# Patient Record
Sex: Male | Born: 1940
Health system: Southern US, Community
[De-identification: ages and names within clinical notes are randomized; demographics above are authoritative.]

## PROBLEM LIST (undated history)

## (undated) DIAGNOSIS — E079 Disorder of thyroid, unspecified: Secondary | ICD-10-CM

## (undated) DIAGNOSIS — E119 Type 2 diabetes mellitus without complications: Secondary | ICD-10-CM

## (undated) DIAGNOSIS — C801 Malignant (primary) neoplasm, unspecified: Secondary | ICD-10-CM

## (undated) DIAGNOSIS — T7840XA Allergy, unspecified, initial encounter: Secondary | ICD-10-CM

## (undated) DIAGNOSIS — E785 Hyperlipidemia, unspecified: Secondary | ICD-10-CM

## (undated) DIAGNOSIS — I10 Essential (primary) hypertension: Secondary | ICD-10-CM

## (undated) DIAGNOSIS — M1990 Unspecified osteoarthritis, unspecified site: Secondary | ICD-10-CM

## (undated) DIAGNOSIS — J45909 Unspecified asthma, uncomplicated: Secondary | ICD-10-CM

## (undated) DIAGNOSIS — I4892 Unspecified atrial flutter: Secondary | ICD-10-CM

## (undated) DIAGNOSIS — I4891 Unspecified atrial fibrillation: Secondary | ICD-10-CM

## (undated) DIAGNOSIS — D126 Benign neoplasm of colon, unspecified: Secondary | ICD-10-CM

## (undated) HISTORY — DX: Benign neoplasm of colon, unspecified: D12.6

## (undated) HISTORY — PX: OTHER SURGICAL HISTORY: SHX169

## (undated) HISTORY — DX: Unspecified atrial fibrillation: I48.91

## (undated) HISTORY — DX: Type 2 diabetes mellitus without complications: E11.9

## (undated) HISTORY — DX: Hyperlipidemia, unspecified: E78.5

## (undated) HISTORY — PX: COLONOSCOPY: SHX174

## (undated) HISTORY — PX: INGUINAL HERNIA REPAIR: SUR1180

## (undated) HISTORY — DX: Allergy, unspecified, initial encounter: T78.40XA

## (undated) HISTORY — DX: Unspecified osteoarthritis, unspecified site: M19.90

## (undated) HISTORY — PX: TONSILLECTOMY: SUR1361

## (undated) HISTORY — DX: Unspecified asthma, uncomplicated: J45.909

## (undated) HISTORY — DX: Essential (primary) hypertension: I10

## (undated) HISTORY — DX: Disorder of thyroid, unspecified: E07.9

## (undated) HISTORY — DX: Malignant (primary) neoplasm, unspecified: C80.1

## (undated) HISTORY — DX: Unspecified atrial flutter: I48.92

## (undated) HISTORY — PX: MEDIAL PARTIAL KNEE REPLACEMENT: SHX5965

---

## 2016-11-02 DIAGNOSIS — Z23 Encounter for immunization: Secondary | ICD-10-CM | POA: Diagnosis not present

## 2017-01-25 DIAGNOSIS — Z0000 Encounter for general adult medical examination without abnormal findings: Secondary | ICD-10-CM | POA: Diagnosis not present

## 2017-01-25 DIAGNOSIS — R5383 Other fatigue: Secondary | ICD-10-CM | POA: Diagnosis not present

## 2017-01-25 DIAGNOSIS — Z713 Dietary counseling and surveillance: Secondary | ICD-10-CM | POA: Diagnosis not present

## 2017-01-25 DIAGNOSIS — L819 Disorder of pigmentation, unspecified: Secondary | ICD-10-CM | POA: Diagnosis not present

## 2017-01-25 DIAGNOSIS — Z0001 Encounter for general adult medical examination with abnormal findings: Secondary | ICD-10-CM | POA: Diagnosis not present

## 2017-01-25 DIAGNOSIS — Z1389 Encounter for screening for other disorder: Secondary | ICD-10-CM | POA: Diagnosis not present

## 2017-01-25 DIAGNOSIS — Z6828 Body mass index (BMI) 28.0-28.9, adult: Secondary | ICD-10-CM | POA: Diagnosis not present

## 2017-01-25 DIAGNOSIS — Z91013 Allergy to seafood: Secondary | ICD-10-CM | POA: Diagnosis not present

## 2017-01-25 DIAGNOSIS — I10 Essential (primary) hypertension: Secondary | ICD-10-CM | POA: Diagnosis not present

## 2017-01-25 DIAGNOSIS — E785 Hyperlipidemia, unspecified: Secondary | ICD-10-CM | POA: Diagnosis not present

## 2017-01-25 DIAGNOSIS — E119 Type 2 diabetes mellitus without complications: Secondary | ICD-10-CM | POA: Diagnosis not present

## 2017-01-25 DIAGNOSIS — E039 Hypothyroidism, unspecified: Secondary | ICD-10-CM | POA: Diagnosis not present

## 2017-03-13 DIAGNOSIS — I10 Essential (primary) hypertension: Secondary | ICD-10-CM | POA: Diagnosis not present

## 2017-03-13 DIAGNOSIS — E039 Hypothyroidism, unspecified: Secondary | ICD-10-CM | POA: Diagnosis not present

## 2017-03-13 DIAGNOSIS — E785 Hyperlipidemia, unspecified: Secondary | ICD-10-CM | POA: Diagnosis not present

## 2017-03-13 DIAGNOSIS — J45909 Unspecified asthma, uncomplicated: Secondary | ICD-10-CM | POA: Diagnosis not present

## 2017-03-13 DIAGNOSIS — Z1389 Encounter for screening for other disorder: Secondary | ICD-10-CM | POA: Diagnosis not present

## 2017-03-13 DIAGNOSIS — Z713 Dietary counseling and surveillance: Secondary | ICD-10-CM | POA: Diagnosis not present

## 2017-03-13 DIAGNOSIS — Z91013 Allergy to seafood: Secondary | ICD-10-CM | POA: Diagnosis not present

## 2017-03-13 DIAGNOSIS — L819 Disorder of pigmentation, unspecified: Secondary | ICD-10-CM | POA: Diagnosis not present

## 2017-03-13 DIAGNOSIS — Z0000 Encounter for general adult medical examination without abnormal findings: Secondary | ICD-10-CM | POA: Diagnosis not present

## 2017-03-13 DIAGNOSIS — E119 Type 2 diabetes mellitus without complications: Secondary | ICD-10-CM | POA: Diagnosis not present

## 2017-03-20 DIAGNOSIS — Z0000 Encounter for general adult medical examination without abnormal findings: Secondary | ICD-10-CM | POA: Diagnosis not present

## 2017-03-20 DIAGNOSIS — Z91013 Allergy to seafood: Secondary | ICD-10-CM | POA: Diagnosis not present

## 2017-03-20 DIAGNOSIS — L819 Disorder of pigmentation, unspecified: Secondary | ICD-10-CM | POA: Diagnosis not present

## 2017-03-20 DIAGNOSIS — R29890 Loss of height: Secondary | ICD-10-CM | POA: Diagnosis not present

## 2017-03-20 DIAGNOSIS — I10 Essential (primary) hypertension: Secondary | ICD-10-CM | POA: Diagnosis not present

## 2017-03-20 DIAGNOSIS — J45909 Unspecified asthma, uncomplicated: Secondary | ICD-10-CM | POA: Diagnosis not present

## 2017-03-20 DIAGNOSIS — E119 Type 2 diabetes mellitus without complications: Secondary | ICD-10-CM | POA: Diagnosis not present

## 2017-03-20 DIAGNOSIS — E785 Hyperlipidemia, unspecified: Secondary | ICD-10-CM | POA: Diagnosis not present

## 2017-03-20 DIAGNOSIS — M810 Age-related osteoporosis without current pathological fracture: Secondary | ICD-10-CM | POA: Diagnosis not present

## 2017-03-20 DIAGNOSIS — E039 Hypothyroidism, unspecified: Secondary | ICD-10-CM | POA: Diagnosis not present

## 2017-03-20 DIAGNOSIS — E291 Testicular hypofunction: Secondary | ICD-10-CM | POA: Diagnosis not present

## 2017-03-20 DIAGNOSIS — M85851 Other specified disorders of bone density and structure, right thigh: Secondary | ICD-10-CM | POA: Diagnosis not present

## 2017-03-20 DIAGNOSIS — Z6827 Body mass index (BMI) 27.0-27.9, adult: Secondary | ICD-10-CM | POA: Diagnosis not present

## 2017-03-20 DIAGNOSIS — Z780 Asymptomatic menopausal state: Secondary | ICD-10-CM | POA: Diagnosis not present

## 2017-03-20 DIAGNOSIS — Z713 Dietary counseling and surveillance: Secondary | ICD-10-CM | POA: Diagnosis not present

## 2017-04-28 DIAGNOSIS — E039 Hypothyroidism, unspecified: Secondary | ICD-10-CM | POA: Diagnosis not present

## 2017-10-06 DIAGNOSIS — Z23 Encounter for immunization: Secondary | ICD-10-CM | POA: Diagnosis not present

## 2017-10-13 DIAGNOSIS — E291 Testicular hypofunction: Secondary | ICD-10-CM | POA: Diagnosis not present

## 2017-10-13 DIAGNOSIS — H3530 Unspecified macular degeneration: Secondary | ICD-10-CM | POA: Diagnosis not present

## 2017-10-13 DIAGNOSIS — E1165 Type 2 diabetes mellitus with hyperglycemia: Secondary | ICD-10-CM | POA: Diagnosis not present

## 2017-10-13 DIAGNOSIS — I10 Essential (primary) hypertension: Secondary | ICD-10-CM | POA: Diagnosis not present

## 2017-10-13 DIAGNOSIS — E782 Mixed hyperlipidemia: Secondary | ICD-10-CM | POA: Diagnosis not present

## 2017-10-13 DIAGNOSIS — E039 Hypothyroidism, unspecified: Secondary | ICD-10-CM | POA: Diagnosis not present

## 2017-11-01 DIAGNOSIS — H2513 Age-related nuclear cataract, bilateral: Secondary | ICD-10-CM | POA: Diagnosis not present

## 2017-11-01 DIAGNOSIS — H353132 Nonexudative age-related macular degeneration, bilateral, intermediate dry stage: Secondary | ICD-10-CM | POA: Diagnosis not present

## 2017-11-01 DIAGNOSIS — E119 Type 2 diabetes mellitus without complications: Secondary | ICD-10-CM | POA: Diagnosis not present

## 2017-11-25 DIAGNOSIS — H109 Unspecified conjunctivitis: Secondary | ICD-10-CM | POA: Diagnosis not present

## 2017-12-06 DIAGNOSIS — H109 Unspecified conjunctivitis: Secondary | ICD-10-CM | POA: Diagnosis not present

## 2017-12-06 DIAGNOSIS — J069 Acute upper respiratory infection, unspecified: Secondary | ICD-10-CM | POA: Diagnosis not present

## 2017-12-19 DIAGNOSIS — E291 Testicular hypofunction: Secondary | ICD-10-CM | POA: Diagnosis not present

## 2017-12-19 DIAGNOSIS — Z0000 Encounter for general adult medical examination without abnormal findings: Secondary | ICD-10-CM | POA: Diagnosis not present

## 2017-12-19 DIAGNOSIS — E039 Hypothyroidism, unspecified: Secondary | ICD-10-CM | POA: Diagnosis not present

## 2017-12-19 DIAGNOSIS — E782 Mixed hyperlipidemia: Secondary | ICD-10-CM | POA: Diagnosis not present

## 2017-12-19 DIAGNOSIS — N390 Urinary tract infection, site not specified: Secondary | ICD-10-CM | POA: Diagnosis not present

## 2017-12-19 DIAGNOSIS — Z125 Encounter for screening for malignant neoplasm of prostate: Secondary | ICD-10-CM | POA: Diagnosis not present

## 2017-12-19 DIAGNOSIS — I10 Essential (primary) hypertension: Secondary | ICD-10-CM | POA: Diagnosis not present

## 2017-12-26 DIAGNOSIS — Z1211 Encounter for screening for malignant neoplasm of colon: Secondary | ICD-10-CM | POA: Diagnosis not present

## 2017-12-26 DIAGNOSIS — Z1212 Encounter for screening for malignant neoplasm of rectum: Secondary | ICD-10-CM | POA: Diagnosis not present

## 2017-12-28 DIAGNOSIS — E039 Hypothyroidism, unspecified: Secondary | ICD-10-CM | POA: Diagnosis not present

## 2017-12-28 DIAGNOSIS — R972 Elevated prostate specific antigen [PSA]: Secondary | ICD-10-CM | POA: Diagnosis not present

## 2017-12-28 DIAGNOSIS — E1165 Type 2 diabetes mellitus with hyperglycemia: Secondary | ICD-10-CM | POA: Diagnosis not present

## 2017-12-28 DIAGNOSIS — I10 Essential (primary) hypertension: Secondary | ICD-10-CM | POA: Diagnosis not present

## 2017-12-28 DIAGNOSIS — E291 Testicular hypofunction: Secondary | ICD-10-CM | POA: Diagnosis not present

## 2017-12-28 DIAGNOSIS — E782 Mixed hyperlipidemia: Secondary | ICD-10-CM | POA: Diagnosis not present

## 2018-01-09 ENCOUNTER — Encounter: Payer: Self-pay | Admitting: Gastroenterology

## 2018-01-09 DIAGNOSIS — R195 Other fecal abnormalities: Secondary | ICD-10-CM | POA: Diagnosis not present

## 2018-01-23 DIAGNOSIS — R972 Elevated prostate specific antigen [PSA]: Secondary | ICD-10-CM | POA: Diagnosis not present

## 2018-01-23 DIAGNOSIS — N5201 Erectile dysfunction due to arterial insufficiency: Secondary | ICD-10-CM | POA: Diagnosis not present

## 2018-02-26 ENCOUNTER — Other Ambulatory Visit: Payer: Self-pay

## 2018-02-26 ENCOUNTER — Ambulatory Visit (AMBULATORY_SURGERY_CENTER): Payer: Self-pay | Admitting: *Deleted

## 2018-02-26 VITALS — Ht 71.0 in | Wt 197.0 lb

## 2018-02-26 DIAGNOSIS — R195 Other fecal abnormalities: Secondary | ICD-10-CM

## 2018-02-26 NOTE — Progress Notes (Signed)
No egg or soy allergy known to patient  No issues with past sedation with any surgeries  or procedures, no intubation problems  No diet pills per patient No home 02 use per patient  No blood thinners per patient  Pt denies issues with constipation  No A fib or A flutter  EMMI video sent to pt's e mail pt declined   

## 2018-03-08 DIAGNOSIS — E291 Testicular hypofunction: Secondary | ICD-10-CM | POA: Diagnosis not present

## 2018-03-08 DIAGNOSIS — E039 Hypothyroidism, unspecified: Secondary | ICD-10-CM | POA: Diagnosis not present

## 2018-03-12 ENCOUNTER — Ambulatory Visit (AMBULATORY_SURGERY_CENTER): Payer: Medicare Other | Admitting: Gastroenterology

## 2018-03-12 ENCOUNTER — Other Ambulatory Visit: Payer: Self-pay

## 2018-03-12 ENCOUNTER — Encounter: Payer: Self-pay | Admitting: Gastroenterology

## 2018-03-12 VITALS — BP 98/60 | HR 71 | Temp 97.7°F | Resp 16 | Ht 71.0 in | Wt 197.0 lb

## 2018-03-12 DIAGNOSIS — R195 Other fecal abnormalities: Secondary | ICD-10-CM

## 2018-03-12 DIAGNOSIS — D122 Benign neoplasm of ascending colon: Secondary | ICD-10-CM | POA: Diagnosis not present

## 2018-03-12 DIAGNOSIS — Z1211 Encounter for screening for malignant neoplasm of colon: Secondary | ICD-10-CM | POA: Diagnosis not present

## 2018-03-12 MED ORDER — SODIUM CHLORIDE 0.9 % IV SOLN: 500.0000 mL | Freq: Once | INTRAVENOUS | Status: DC

## 2018-03-12 NOTE — Progress Notes (Signed)
Pt's states no medical or surgical changes since previsit or office visit. 

## 2018-03-12 NOTE — Patient Instructions (Signed)
*  Handouts given on polyps and diverticulosis.  Needs a different prep than Miralax.    YOU HAD AN ENDOSCOPIC PROCEDURE TODAY AT [facility]:   Refer to the procedure report that was given to you for any specific questions about what was found during the examination.  If the procedure report does not answer your questions, please call your gastroenterologist to clarify.  If you requested that your care partner not be given the details of your procedure findings, then the procedure report has been included in a sealed envelope for you to review at your convenience later.  YOU SHOULD EXPECT: Some feelings of bloating in the abdomen. Passage of more gas than usual.  Walking can help get rid of the air that was put into your GI tract during the procedure and reduce the bloating. If you had a lower endoscopy (such as a colonoscopy or flexible sigmoidoscopy) you may notice spotting of blood in your stool or on the toilet paper. If you underwent a bowel prep for your procedure, you may not have a normal bowel movement for a few days.  Please Note:  You might notice some irritation and congestion in your nose or some drainage.  This is from the oxygen used during your procedure.  There is no need for concern and it should clear up in a day or so.  SYMPTOMS TO REPORT IMMEDIATELY:   Following lower endoscopy (colonoscopy or flexible sigmoidoscopy):  Excessive amounts of blood in the stool  Significant tenderness or worsening of abdominal pains  Swelling of the abdomen that is new, acute  Fever of 100F or higher   For urgent or emergent issues, a gastroenterologist can be reached at any hour by calling [phone].   DIET:  We do recommend a small meal at first, but then you may proceed to your regular diet.  Drink plenty of fluids but you should avoid alcoholic beverages for 24 hours.  ACTIVITY:  You should plan to take it easy for the rest of today and you should NOT DRIVE or use  heavy machinery until tomorrow (because of the sedation medicines used during the test).    FOLLOW UP: Our staff will call the number listed on your records the next business day following your procedure to check on you and address any questions or concerns that you may have regarding the information given to you following your procedure. If we do not reach you, we will leave a message.  However, if you are feeling well and you are not experiencing any problems, there is no need to return our call.  We will assume that you have returned to your regular daily activities without incident.  If any biopsies were taken you will be contacted by phone or by letter within the next 1-3 weeks.  Please call us at [phone] if you have not heard about the biopsies in 3 weeks.    SIGNATURES/CONFIDENTIALITY: You and/or your care partner have signed paperwork which will be entered into your electronic medical record.  These signatures attest to the fact that that the information above on your After Visit Summary has been reviewed and is understood.  Full responsibility of the confidentiality of this discharge information lies with you and/or your care-partner.

## 2018-03-12 NOTE — Op Note (Signed)
[facility] Patient Name: Isaac Clark Procedure Date: 03/12/2018 7:57 AM MRN: [id number] Endoscopist: Mallie Mussel L. Loletha Carrow , MD Age: 77 Referring MD:  Date of Birth: 1940-12-09 Gender: Male Account #: [id number] Procedure:                Colonoscopy Indications:              Positive Cologuard test Medicines:                Monitored Anesthesia Care Procedure:                Pre-Anesthesia Assessment:                           - Prior to the procedure, a History and Physical                            was performed, and patient medications and                            allergies were reviewed. The patient's tolerance of                            previous anesthesia was also reviewed. The risks                            and benefits of the procedure and the sedation                            options and risks were discussed with the patient.                            All questions were answered, and informed consent                            was obtained. Anticoagulants: The patient has taken                            aspirin. It was decided not to withhold this                            medication prior to the procedure. ASA Grade                            Assessment: II - A patient with mild systemic                            disease. After reviewing the risks and benefits,                            the patient was deemed in satisfactory condition to                            undergo the procedure.  After obtaining informed consent, the colonoscope                            was passed under direct vision. Throughout the                            procedure, the patient's blood pressure, pulse, and                            oxygen saturations were monitored continuously. The                            Colonoscope was introduced through the anus and                            advanced to the the cecum, identified by                             appendiceal orifice and ileocecal valve. The                            colonoscopy was performed without difficulty. The                            patient tolerated the procedure well. The quality                            of the bowel preparation was initially fair, then                            improved to good with lavage. The ileocecal valve,                            appendiceal orifice, and rectum were photographed.                            The quality of the bowel preparation was evaluated                            using the BBPS [facility] Bowel Preparation Scale)                            with scores of: Right Colon = 2, Transverse Colon =                            2 and Left Colon = 2. The total BBPS score equals                            6, after lavage. The bowel preparation used was                            Miralax. Scope In: 8:14:01 AM Scope Out: 8:33:56 AM Scope Withdrawal Time: 0 hours 17  minutes 5 seconds  Total Procedure Duration: 0 hours 19 minutes 55 seconds  Findings:                 The perianal and digital rectal examinations were                            normal.                           A 12 mm polyp was found in the proximal ascending                            colon. The polyp was sessile. The polyp was removed                            en bloc with a hot snare. Resection and piecemeal                            retrieval were complete.                           A 8 mm polyp was found in the proximal ascending                            colon. The polyp was sessile. The polyp was removed                            with a cold snare. Resection and retrieval were                            complete.                           Multiple large-mouthed diverticula were found in                            the entire colon.                           The exam was otherwise without abnormality on                            direct and retroflexion  views. Complications:            No immediate complications. Estimated Blood Loss:     Estimated blood loss was minimal. Impression:               - One 12 mm polyp in the proximal ascending colon,                            removed with a hot snare. Resected and retrieved.                           - One 8 mm polyp in the proximal ascending colon,  removed with a cold snare. Resected and retrieved.                           - Diverticulosis in the entire examined colon.                           - The examination was otherwise normal on direct                            and retroflexion views. Recommendation:           - Patient has a contact number available for                            emergencies. The signs and symptoms of potential                            delayed complications were discussed with the                            patient. Return to normal activities tomorrow.                            Written discharge instructions were provided to the                            patient.                           - Resume previous diet.                           - Continue present medications.                           - Await pathology results.                           - Repeat colonoscopy in 3 years for surveillance                            with alternate prep. Raciel Caffrey L. Loletha Carrow, MD 03/12/2018 8:42:49 AM This report has been signed electronically.

## 2018-03-12 NOTE — Progress Notes (Signed)
Called to room to assist during endoscopic procedure.  Patient ID and intended procedure confirmed with present staff. Received instructions for my participation in the procedure from the performing physician.  

## 2018-03-12 NOTE — Progress Notes (Signed)
To PACU, VSS. Report to RN.tb 

## 2018-03-13 ENCOUNTER — Telehealth: Payer: Self-pay

## 2018-03-13 NOTE — Telephone Encounter (Signed)
  Follow up Call-  Call back number 03/12/2018  Post procedure Call Back phone  # [phone]  Permission to leave phone message Yes     Patient questions:  Do you have a fever, pain , or abdominal swelling? No. Pain Score  0 *  Have you tolerated food without any problems? Yes.    Have you been able to return to your normal activities? Yes.    Do you have any questions about your discharge instructions: Diet   No. Medications  No. Follow up visit  No.  Do you have questions or concerns about your Care? No.  Actions: * If pain score is 4 or above: No action needed, pain <4.

## 2018-03-15 DIAGNOSIS — E291 Testicular hypofunction: Secondary | ICD-10-CM | POA: Diagnosis not present

## 2018-03-15 DIAGNOSIS — E039 Hypothyroidism, unspecified: Secondary | ICD-10-CM | POA: Diagnosis not present

## 2018-03-15 DIAGNOSIS — R002 Palpitations: Secondary | ICD-10-CM | POA: Diagnosis not present

## 2018-03-15 DIAGNOSIS — R972 Elevated prostate specific antigen [PSA]: Secondary | ICD-10-CM | POA: Diagnosis not present

## 2018-03-15 DIAGNOSIS — E1165 Type 2 diabetes mellitus with hyperglycemia: Secondary | ICD-10-CM | POA: Diagnosis not present

## 2018-03-18 ENCOUNTER — Encounter: Payer: Self-pay | Admitting: Gastroenterology

## 2018-04-17 DIAGNOSIS — R972 Elevated prostate specific antigen [PSA]: Secondary | ICD-10-CM | POA: Diagnosis not present

## 2018-04-24 DIAGNOSIS — R972 Elevated prostate specific antigen [PSA]: Secondary | ICD-10-CM | POA: Diagnosis not present

## 2018-04-24 DIAGNOSIS — N5201 Erectile dysfunction due to arterial insufficiency: Secondary | ICD-10-CM | POA: Diagnosis not present

## 2018-04-24 DIAGNOSIS — E349 Endocrine disorder, unspecified: Secondary | ICD-10-CM | POA: Diagnosis not present

## 2018-08-09 DIAGNOSIS — E039 Hypothyroidism, unspecified: Secondary | ICD-10-CM | POA: Diagnosis not present

## 2018-08-09 DIAGNOSIS — E1165 Type 2 diabetes mellitus with hyperglycemia: Secondary | ICD-10-CM | POA: Diagnosis not present

## 2018-08-09 DIAGNOSIS — E291 Testicular hypofunction: Secondary | ICD-10-CM | POA: Diagnosis not present

## 2018-08-16 DIAGNOSIS — E039 Hypothyroidism, unspecified: Secondary | ICD-10-CM | POA: Diagnosis not present

## 2018-08-16 DIAGNOSIS — E1165 Type 2 diabetes mellitus with hyperglycemia: Secondary | ICD-10-CM | POA: Diagnosis not present

## 2018-08-16 DIAGNOSIS — E291 Testicular hypofunction: Secondary | ICD-10-CM | POA: Diagnosis not present

## 2018-08-16 DIAGNOSIS — E782 Mixed hyperlipidemia: Secondary | ICD-10-CM | POA: Diagnosis not present

## 2018-08-16 DIAGNOSIS — I10 Essential (primary) hypertension: Secondary | ICD-10-CM | POA: Diagnosis not present

## 2018-08-16 DIAGNOSIS — Z23 Encounter for immunization: Secondary | ICD-10-CM | POA: Diagnosis not present

## 2018-11-06 DIAGNOSIS — H2513 Age-related nuclear cataract, bilateral: Secondary | ICD-10-CM | POA: Diagnosis not present

## 2018-11-06 DIAGNOSIS — H353132 Nonexudative age-related macular degeneration, bilateral, intermediate dry stage: Secondary | ICD-10-CM | POA: Diagnosis not present

## 2018-11-06 DIAGNOSIS — E119 Type 2 diabetes mellitus without complications: Secondary | ICD-10-CM | POA: Diagnosis not present

## 2018-12-19 DIAGNOSIS — H109 Unspecified conjunctivitis: Secondary | ICD-10-CM | POA: Diagnosis not present

## 2019-01-03 DIAGNOSIS — E039 Hypothyroidism, unspecified: Secondary | ICD-10-CM | POA: Diagnosis not present

## 2019-01-03 DIAGNOSIS — E1165 Type 2 diabetes mellitus with hyperglycemia: Secondary | ICD-10-CM | POA: Diagnosis not present

## 2019-01-03 DIAGNOSIS — I10 Essential (primary) hypertension: Secondary | ICD-10-CM | POA: Diagnosis not present

## 2019-01-03 DIAGNOSIS — E782 Mixed hyperlipidemia: Secondary | ICD-10-CM | POA: Diagnosis not present

## 2019-01-08 DIAGNOSIS — Z0000 Encounter for general adult medical examination without abnormal findings: Secondary | ICD-10-CM | POA: Diagnosis not present

## 2019-01-08 DIAGNOSIS — E782 Mixed hyperlipidemia: Secondary | ICD-10-CM | POA: Diagnosis not present

## 2019-01-08 DIAGNOSIS — E1165 Type 2 diabetes mellitus with hyperglycemia: Secondary | ICD-10-CM | POA: Diagnosis not present

## 2019-01-08 DIAGNOSIS — I10 Essential (primary) hypertension: Secondary | ICD-10-CM | POA: Diagnosis not present

## 2019-07-23 DIAGNOSIS — I10 Essential (primary) hypertension: Secondary | ICD-10-CM | POA: Diagnosis not present

## 2019-07-23 DIAGNOSIS — E1165 Type 2 diabetes mellitus with hyperglycemia: Secondary | ICD-10-CM | POA: Diagnosis not present

## 2019-07-23 DIAGNOSIS — E782 Mixed hyperlipidemia: Secondary | ICD-10-CM | POA: Diagnosis not present

## 2019-07-30 DIAGNOSIS — I10 Essential (primary) hypertension: Secondary | ICD-10-CM | POA: Diagnosis not present

## 2019-07-30 DIAGNOSIS — E1165 Type 2 diabetes mellitus with hyperglycemia: Secondary | ICD-10-CM | POA: Diagnosis not present

## 2019-07-30 DIAGNOSIS — L570 Actinic keratosis: Secondary | ICD-10-CM | POA: Diagnosis not present

## 2019-07-30 DIAGNOSIS — Z23 Encounter for immunization: Secondary | ICD-10-CM | POA: Diagnosis not present

## 2019-07-30 DIAGNOSIS — E782 Mixed hyperlipidemia: Secondary | ICD-10-CM | POA: Diagnosis not present

## 2019-08-14 DIAGNOSIS — L821 Other seborrheic keratosis: Secondary | ICD-10-CM | POA: Diagnosis not present

## 2019-08-14 DIAGNOSIS — C44222 Squamous cell carcinoma of skin of right ear and external auricular canal: Secondary | ICD-10-CM | POA: Diagnosis not present

## 2019-08-14 DIAGNOSIS — L570 Actinic keratosis: Secondary | ICD-10-CM | POA: Diagnosis not present

## 2019-09-12 DIAGNOSIS — D1801 Hemangioma of skin and subcutaneous tissue: Secondary | ICD-10-CM | POA: Diagnosis not present

## 2019-09-12 DIAGNOSIS — D225 Melanocytic nevi of trunk: Secondary | ICD-10-CM | POA: Diagnosis not present

## 2019-09-12 DIAGNOSIS — L814 Other melanin hyperpigmentation: Secondary | ICD-10-CM | POA: Diagnosis not present

## 2019-09-12 DIAGNOSIS — L821 Other seborrheic keratosis: Secondary | ICD-10-CM | POA: Diagnosis not present

## 2019-09-12 DIAGNOSIS — L570 Actinic keratosis: Secondary | ICD-10-CM | POA: Diagnosis not present

## 2019-09-26 DIAGNOSIS — C44222 Squamous cell carcinoma of skin of right ear and external auricular canal: Secondary | ICD-10-CM | POA: Diagnosis not present

## 2019-11-07 DIAGNOSIS — H2513 Age-related nuclear cataract, bilateral: Secondary | ICD-10-CM | POA: Diagnosis not present

## 2019-11-07 DIAGNOSIS — H02831 Dermatochalasis of right upper eyelid: Secondary | ICD-10-CM | POA: Diagnosis not present

## 2019-11-07 DIAGNOSIS — H02834 Dermatochalasis of left upper eyelid: Secondary | ICD-10-CM | POA: Diagnosis not present

## 2019-11-07 DIAGNOSIS — H353132 Nonexudative age-related macular degeneration, bilateral, intermediate dry stage: Secondary | ICD-10-CM | POA: Diagnosis not present

## 2019-11-07 DIAGNOSIS — E119 Type 2 diabetes mellitus without complications: Secondary | ICD-10-CM | POA: Diagnosis not present

## 2019-12-16 DIAGNOSIS — L570 Actinic keratosis: Secondary | ICD-10-CM | POA: Diagnosis not present

## 2019-12-16 DIAGNOSIS — D229 Melanocytic nevi, unspecified: Secondary | ICD-10-CM | POA: Diagnosis not present

## 2019-12-16 DIAGNOSIS — L905 Scar conditions and fibrosis of skin: Secondary | ICD-10-CM | POA: Diagnosis not present

## 2019-12-16 DIAGNOSIS — L821 Other seborrheic keratosis: Secondary | ICD-10-CM | POA: Diagnosis not present

## 2019-12-16 DIAGNOSIS — L820 Inflamed seborrheic keratosis: Secondary | ICD-10-CM | POA: Diagnosis not present

## 2020-01-14 DIAGNOSIS — I10 Essential (primary) hypertension: Secondary | ICD-10-CM | POA: Diagnosis not present

## 2020-01-14 DIAGNOSIS — E1165 Type 2 diabetes mellitus with hyperglycemia: Secondary | ICD-10-CM | POA: Diagnosis not present

## 2020-01-14 DIAGNOSIS — E782 Mixed hyperlipidemia: Secondary | ICD-10-CM | POA: Diagnosis not present

## 2020-01-14 DIAGNOSIS — Z0000 Encounter for general adult medical examination without abnormal findings: Secondary | ICD-10-CM | POA: Diagnosis not present

## 2020-01-27 DIAGNOSIS — E782 Mixed hyperlipidemia: Secondary | ICD-10-CM | POA: Diagnosis not present

## 2020-01-27 DIAGNOSIS — E1165 Type 2 diabetes mellitus with hyperglycemia: Secondary | ICD-10-CM | POA: Diagnosis not present

## 2020-01-27 DIAGNOSIS — Z0000 Encounter for general adult medical examination without abnormal findings: Secondary | ICD-10-CM | POA: Diagnosis not present

## 2020-01-27 DIAGNOSIS — E039 Hypothyroidism, unspecified: Secondary | ICD-10-CM | POA: Diagnosis not present

## 2020-01-27 DIAGNOSIS — I10 Essential (primary) hypertension: Secondary | ICD-10-CM | POA: Diagnosis not present

## 2020-03-23 DIAGNOSIS — H353132 Nonexudative age-related macular degeneration, bilateral, intermediate dry stage: Secondary | ICD-10-CM | POA: Diagnosis not present

## 2020-03-23 DIAGNOSIS — H31093 Other chorioretinal scars, bilateral: Secondary | ICD-10-CM | POA: Diagnosis not present

## 2020-03-23 DIAGNOSIS — H02831 Dermatochalasis of right upper eyelid: Secondary | ICD-10-CM | POA: Diagnosis not present

## 2020-03-23 DIAGNOSIS — H43812 Vitreous degeneration, left eye: Secondary | ICD-10-CM | POA: Diagnosis not present

## 2020-03-23 DIAGNOSIS — H02834 Dermatochalasis of left upper eyelid: Secondary | ICD-10-CM | POA: Diagnosis not present

## 2020-04-20 DIAGNOSIS — H43812 Vitreous degeneration, left eye: Secondary | ICD-10-CM | POA: Diagnosis not present

## 2020-07-27 DIAGNOSIS — E782 Mixed hyperlipidemia: Secondary | ICD-10-CM | POA: Diagnosis not present

## 2020-07-27 DIAGNOSIS — E1165 Type 2 diabetes mellitus with hyperglycemia: Secondary | ICD-10-CM | POA: Diagnosis not present

## 2020-07-27 DIAGNOSIS — I10 Essential (primary) hypertension: Secondary | ICD-10-CM | POA: Diagnosis not present

## 2020-08-03 DIAGNOSIS — E1165 Type 2 diabetes mellitus with hyperglycemia: Secondary | ICD-10-CM | POA: Diagnosis not present

## 2020-08-03 DIAGNOSIS — E039 Hypothyroidism, unspecified: Secondary | ICD-10-CM | POA: Diagnosis not present

## 2020-08-03 DIAGNOSIS — I10 Essential (primary) hypertension: Secondary | ICD-10-CM | POA: Diagnosis not present

## 2020-08-03 DIAGNOSIS — E782 Mixed hyperlipidemia: Secondary | ICD-10-CM | POA: Diagnosis not present

## 2020-09-14 DIAGNOSIS — D225 Melanocytic nevi of trunk: Secondary | ICD-10-CM | POA: Diagnosis not present

## 2020-09-14 DIAGNOSIS — L905 Scar conditions and fibrosis of skin: Secondary | ICD-10-CM | POA: Diagnosis not present

## 2020-09-14 DIAGNOSIS — L821 Other seborrheic keratosis: Secondary | ICD-10-CM | POA: Diagnosis not present

## 2020-09-14 DIAGNOSIS — L570 Actinic keratosis: Secondary | ICD-10-CM | POA: Diagnosis not present

## 2020-09-14 DIAGNOSIS — Z85828 Personal history of other malignant neoplasm of skin: Secondary | ICD-10-CM | POA: Diagnosis not present

## 2020-11-09 DIAGNOSIS — H02834 Dermatochalasis of left upper eyelid: Secondary | ICD-10-CM | POA: Diagnosis not present

## 2020-11-09 DIAGNOSIS — H02831 Dermatochalasis of right upper eyelid: Secondary | ICD-10-CM | POA: Diagnosis not present

## 2020-11-09 DIAGNOSIS — H353132 Nonexudative age-related macular degeneration, bilateral, intermediate dry stage: Secondary | ICD-10-CM | POA: Diagnosis not present

## 2020-11-09 DIAGNOSIS — E119 Type 2 diabetes mellitus without complications: Secondary | ICD-10-CM | POA: Diagnosis not present

## 2020-11-09 DIAGNOSIS — H31093 Other chorioretinal scars, bilateral: Secondary | ICD-10-CM | POA: Diagnosis not present

## 2021-02-01 DIAGNOSIS — I10 Essential (primary) hypertension: Secondary | ICD-10-CM | POA: Diagnosis not present

## 2021-02-01 DIAGNOSIS — E1165 Type 2 diabetes mellitus with hyperglycemia: Secondary | ICD-10-CM | POA: Diagnosis not present

## 2021-02-01 DIAGNOSIS — Z7984 Long term (current) use of oral hypoglycemic drugs: Secondary | ICD-10-CM | POA: Diagnosis not present

## 2021-02-01 DIAGNOSIS — E782 Mixed hyperlipidemia: Secondary | ICD-10-CM | POA: Diagnosis not present

## 2021-02-08 DIAGNOSIS — E1165 Type 2 diabetes mellitus with hyperglycemia: Secondary | ICD-10-CM | POA: Diagnosis not present

## 2021-02-08 DIAGNOSIS — E782 Mixed hyperlipidemia: Secondary | ICD-10-CM | POA: Diagnosis not present

## 2021-02-08 DIAGNOSIS — E039 Hypothyroidism, unspecified: Secondary | ICD-10-CM | POA: Diagnosis not present

## 2021-02-08 DIAGNOSIS — Z0000 Encounter for general adult medical examination without abnormal findings: Secondary | ICD-10-CM | POA: Diagnosis not present

## 2021-02-08 DIAGNOSIS — I10 Essential (primary) hypertension: Secondary | ICD-10-CM | POA: Diagnosis not present

## 2021-02-15 DIAGNOSIS — E782 Mixed hyperlipidemia: Secondary | ICD-10-CM | POA: Diagnosis not present

## 2021-02-15 DIAGNOSIS — H3530 Unspecified macular degeneration: Secondary | ICD-10-CM | POA: Diagnosis not present

## 2021-02-15 DIAGNOSIS — E039 Hypothyroidism, unspecified: Secondary | ICD-10-CM | POA: Diagnosis not present

## 2021-02-15 DIAGNOSIS — N182 Chronic kidney disease, stage 2 (mild): Secondary | ICD-10-CM | POA: Diagnosis not present

## 2021-02-15 DIAGNOSIS — I10 Essential (primary) hypertension: Secondary | ICD-10-CM | POA: Diagnosis not present

## 2021-02-15 DIAGNOSIS — E1122 Type 2 diabetes mellitus with diabetic chronic kidney disease: Secondary | ICD-10-CM | POA: Diagnosis not present

## 2021-04-03 DIAGNOSIS — E782 Mixed hyperlipidemia: Secondary | ICD-10-CM | POA: Diagnosis not present

## 2021-04-03 DIAGNOSIS — I10 Essential (primary) hypertension: Secondary | ICD-10-CM | POA: Diagnosis not present

## 2021-04-03 DIAGNOSIS — Z7984 Long term (current) use of oral hypoglycemic drugs: Secondary | ICD-10-CM | POA: Diagnosis not present

## 2021-04-03 DIAGNOSIS — E1165 Type 2 diabetes mellitus with hyperglycemia: Secondary | ICD-10-CM | POA: Diagnosis not present

## 2021-04-19 DIAGNOSIS — I4892 Unspecified atrial flutter: Secondary | ICD-10-CM | POA: Diagnosis not present

## 2021-04-19 DIAGNOSIS — E1122 Type 2 diabetes mellitus with diabetic chronic kidney disease: Secondary | ICD-10-CM | POA: Diagnosis not present

## 2021-04-19 DIAGNOSIS — R002 Palpitations: Secondary | ICD-10-CM | POA: Diagnosis not present

## 2021-04-19 DIAGNOSIS — I952 Hypotension due to drugs: Secondary | ICD-10-CM | POA: Diagnosis not present

## 2021-04-19 DIAGNOSIS — N182 Chronic kidney disease, stage 2 (mild): Secondary | ICD-10-CM | POA: Diagnosis not present

## 2021-04-19 DIAGNOSIS — I10 Essential (primary) hypertension: Secondary | ICD-10-CM | POA: Diagnosis not present

## 2021-04-19 DIAGNOSIS — E782 Mixed hyperlipidemia: Secondary | ICD-10-CM | POA: Diagnosis not present

## 2021-04-26 DIAGNOSIS — I10 Essential (primary) hypertension: Secondary | ICD-10-CM | POA: Diagnosis not present

## 2021-04-26 DIAGNOSIS — I700 Atherosclerosis of aorta: Secondary | ICD-10-CM | POA: Diagnosis not present

## 2021-04-26 DIAGNOSIS — E782 Mixed hyperlipidemia: Secondary | ICD-10-CM | POA: Diagnosis not present

## 2021-04-26 DIAGNOSIS — E1165 Type 2 diabetes mellitus with hyperglycemia: Secondary | ICD-10-CM | POA: Diagnosis not present

## 2021-04-26 DIAGNOSIS — I4892 Unspecified atrial flutter: Secondary | ICD-10-CM | POA: Diagnosis not present

## 2021-04-30 NOTE — Progress Notes (Signed)
Date:  05/04/2021   ID:  MAYA SCHOLER, DOB 10/15/1941, MRN [id number]  PCP:  Merrilee Seashore, MD  Cardiologist:  Rex Kras, DO, [facility] (established care 05/04/2021)  REASON FOR CONSULT: Atrial flutter  REQUESTING PHYSICIAN:  Merrilee Seashore, MD [address] [place],  [place] [address]  Chief Complaint  Patient presents with  . Atrial Flutter  . New Patient (Initial Visit)    Referred by Merrilee Seashore, MD    HPI  STILES MAXCY is a 80 y.o. male who presents to the office with a chief complaint of " atrial flutter management." Patient's past medical history and cardiovascular risk factors include: Hypertension, hyperlipidemia, erectile dysfunction, non-insulin-dependent diabetes mellitus type 2, former smoker, advanced age.  He is referred to the office at the request of Merrilee Seashore, MD for evaluation of atrial flutter.  Patient has been experiencing episodes of palpitations usually during the morning hours during which time he feels tired and fatigue.  These episodes can be lasting for minutes or up to couple hours.  Recently he started noticing that his smart devices such as his smart watch has mentioned that he may be in atrial fibrillation.  He went to go see his primary care provider in the recent past and was diagnosed with atrial flutter.  He was not started on any AV nodal blocking agents but his blood pressure medications were reduced due to hypotension and feeling tired fatigue and lightheadedness.  He was started on oral anticoagulation given his CHA2DS2-VASc score he is currently on Xarelto and tolerating the medication well without any side effects or intolerances.  Patient does not endorse any evidence of bleeding.  Patient has good functional capacity for age and ambulates about 2 miles on a daily basis.  At times he has noticed feeling tired and fatigue but nothing that would impact his physical endurance.  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  ALLERGIES: Allergies  Allergen Reactions  . Zithromax [Azithromycin] Diarrhea    MEDICATION LIST PRIOR TO VISIT: Current Meds  Medication Sig  . albuterol (PROVENTIL HFA;VENTOLIN HFA) 108 (90 Base) MCG/ACT inhaler Inhale into the lungs every 6 (six) hours as needed for wheezing or shortness of breath.  . Cholecalciferol (VITAMIN D PO) Take 250 mg by mouth daily.  Marland Kitchen CINNAMON PO Take 250 mg by mouth daily.  . Cyanocobalamin (VITAMIN B 12 PO) Take 1 tablet by mouth daily at 6 (six) AM.  . levothyroxine (SYNTHROID, LEVOTHROID) 100 MCG tablet TK 1 T PO QD  . lisinopril (ZESTRIL) 20 MG tablet Take 1 tablet (20 mg total) by mouth every evening.  . metFORMIN (GLUCOPHAGE-XR) 750 MG 24 hr tablet TK 1 T PO QD WITH THE EVE MEAL  . metoprolol succinate (TOPROL XL) 25 MG 24 hr tablet Take 1 tablet (25 mg total) by mouth in the morning.  . Multiple Vitamins-Minerals (ICAPS AREDS 2) CAPS Take by mouth daily.  . sildenafil (VIAGRA) 50 MG tablet Take 60 mg by mouth daily as needed for erectile dysfunction.  . simvastatin (ZOCOR) 20 MG tablet TK 1 T PO ONCE D IN THE EVENING  . XARELTO 20 MG TABS tablet Take 20 mg by mouth daily.  . [DISCONTINUED] aspirin EC 81 MG tablet Take 81 mg by mouth daily.  . [DISCONTINUED] lisinopril-hydrochlorothiazide (PRINZIDE,ZESTORETIC) 20-25 MG tablet TK 1 T PO QD   Current Facility-Administered Medications for the 05/04/21 encounter (Office Visit) with Rex Kras, DO  Medication  . 0.9 %  sodium chloride infusion     PAST MEDICAL HISTORY: Past Medical History:  Diagnosis Date  . Allergy   . Arthritis    mild  . Asthma   . Atrial flutter ([facility])   . Cancer ([facility])    skin  . Diabetes mellitus without complication ([facility])   . Hyperlipidemia   . Hypertension   . Thyroid disease     PAST SURGICAL HISTORY: Past Surgical History:  Procedure Laterality Date  . COLONOSCOPY     . INGUINAL HERNIA REPAIR Bilateral   . MEDIAL PARTIAL KNEE REPLACEMENT Left   . shoulder impingement surgery Right   . TONSILLECTOMY      FAMILY HISTORY: The patient family history includes Heart disease in his father and mother; Hypertension in his father; Stroke in his mother.  SOCIAL HISTORY:  The patient  reports that he has quit smoking. His smoking use included cigarettes. He has a 9.00 pack-year smoking history. He has never used smokeless tobacco. He reports current alcohol use of about 7.0 standard drinks of alcohol per week. He reports that he does not use drugs.  REVIEW OF SYSTEMS: Review of Systems  Constitutional: Positive for malaise/fatigue. Negative for chills and fever.  HENT: Negative for hoarse voice and nosebleeds.   Eyes: Negative for discharge, double vision and pain.  Cardiovascular: Positive for palpitations. Negative for chest pain, claudication, dyspnea on exertion, leg swelling, near-syncope, orthopnea, paroxysmal nocturnal dyspnea and syncope.  Respiratory: Negative for hemoptysis and shortness of breath.   Musculoskeletal: Negative for muscle cramps and myalgias.  Gastrointestinal: Negative for abdominal pain, constipation, diarrhea, hematemesis, hematochezia, melena, nausea and vomiting.  Neurological: Negative for dizziness and light-headedness.    PHYSICAL EXAM: Vitals with BMI 05/04/2021 03/12/2018 03/12/2018  Height 5' 11"  - -  Weight 199 lbs 13 oz - -  BMI 34.19 - -  Systolic 379 98 96  Diastolic 81 60 59  Pulse 70 71 60    CONSTITUTIONAL: Well-developed and well-nourished. No acute distress.  SKIN: Skin is warm and dry. No rash noted. No cyanosis. No pallor. No jaundice HEAD: Normocephalic and atraumatic.  EYES: No scleral icterus MOUTH/THROAT: Moist oral membranes.  NECK: No JVD present. No thyromegaly noted. No carotid bruits  LYMPHATIC: No visible cervical adenopathy.  CHEST Normal respiratory effort. No intercostal retractions  LUNGS:  Clear to auscultation bilaterally.  No stridor. No wheezes. No rales.  CARDIOVASCULAR: Regular rate and rhythm, positive S1-S2, no murmurs rubs or gallops appreciated ABDOMINAL: Soft, nontender, nondistended, positive bowel sounds in all 4 quadrants, no apparent ascites.  EXTREMITIES: No peripheral edema  HEMATOLOGIC: No significant bruising NEUROLOGIC: Oriented to person, place, and time. Nonfocal. Normal muscle tone.  PSYCHIATRIC: Normal mood and affect. Normal behavior. Cooperative  CARDIAC DATABASE: EKG: 04/19/2021 provided by PCP: Atrial flutter, 86 bpm without underlying ischemia or injury pattern. 05/04/2021: NSR, 69bpm, normal axis, without underlying injury pattern.  Echocardiogram: No results found for this or any previous visit from the past 1095 days.   Stress Testing: No results found for this or any previous visit from the past 1095 days.  Heart Catheterization: None  LABORATORY DATA: No flowsheet data found.  No flowsheet data found.  Lipid Panel  No results found for: CHOL, TRIG, HDL, CHOLHDL, VLDL, LDLCALC, LDLDIRECT, LABVLDL  No components found for: NTPROBNP No results for input(s): PROBNP in the last 8760 hours. No results for input(s): TSH in the last 8760 hours.  BMP No results for input(s): NA, K,  CL, CO2, GLUCOSE, BUN, CREATININE, CALCIUM, GFRNONAA, GFRAA in the last 8760 hours.  HEMOGLOBIN A1C No results found for: HGBA1C, MPG   External Labs:  Date Collected: 04/19/2021 , information obtained by PCP.  Potassium: 4.4 Creatinine 1.20 mg/dL. eGFR: 58 mL/min per 1.73 m Hemoglobin: 13.9 g/dL and hematocrit: 41.8 % AST: 18 , ALT: 24 , alkaline phosphatase: 79  TSH: 1.48   Date Collected: 02/08/2021 Lipid profile: Total cholesterol 163 , triglycerides 176 , HDL 62 , LDL 72 Hemoglobin A1c:   IMPRESSION:    ICD-10-CM   1. Paroxysmal atrial flutter (HCC)  I48.92 EKG 12-Lead    metoprolol succinate (TOPROL XL) 25 MG 24 hr tablet    PCV  ECHOCARDIOGRAM COMPLETE    PCV MYOCARDIAL PERFUSION WITH LEXISCAN  2. Long term (current) use of anticoagulants  Z79.01   3. Benign hypertension  I10 lisinopril (ZESTRIL) 20 MG tablet  4. Mixed hyperlipidemia  E78.2   5. Hypothyroidism, unspecified type  E03.9   6. Erectile dysfunction, unspecified erectile dysfunction type  N52.9   7. Type 2 diabetes mellitus with hyperglycemia, without long-term current use of insulin (HCC)  E11.65   8. Former smoker  Z87.891      RECOMMENDATIONS: ARISTIDE WAGGLE is a 80 y.o. male whose past medical history and cardiac risk factors include: Hypertension, hyperlipidemia, erectile dysfunction, non-insulin-dependent diabetes mellitus type 2, former smoker, advanced age.  Paroxysmal atrial flutter:  Rate control: Start Toprol-XL 25 mg p.o. every morning  Rhythm control: N/A  Thromboembolic prophylaxis: Xarelto . I had a long and detailed discussion with the patient regarding the incidence, etiology, pathophysiology, prognosis, and therapeutic options for atrial flutter.  Specifically we discussed oral anticoagulation for stroke prevention.   . CHA2DS2-VASc SCORE is 4 (age>75, DM, HTN) which correlates to 4.8 % risk of stroke per year.  . Plan echocardiogram. . Plan nuclear stress test to rule out reversible ischemia given the new diagnosis of atrial fibrillation. . Outside labs independently reviewed.  Thyroid function within normal limits.  Long-term oral anticoagulation:  Already on oral anticoagulation prior to establishing care.  Discussed the risks, benefits, and alternatives to oral anticoagulation.  Hemoglobin at baseline relatively stable.  Recommend checking hemoglobin hematocrit every 6 months to evaluate for anemia.  Patient is asked to either follow-up with me or his PCP for this.  Patient has been educated on the importance of monitoring for evidence of bleeding which includes but not limited to hemoptysis, hematochezia, melanotic  stools, and hematuria. Patient educated on fall precautions and if he was to be injured despite the mechanism of injury he is to seek medical attention at the closest ER since he is on blood thinners.  Patient understands importance of this because if internal bleeding is not treated in a timely manner it may further lead to morbidity and/or mortality.  Patient voices understanding of these recommendations and provides verbal feedback.  Benign essential hypertension:  Office blood pressure at goal.   Patient been experiencing lightheaded and dizziness and therefore his antihypertensive medications have been down titrated by his PCP.  I feel that he would benefit from AV nodal blocking agents to help prevent flareups of paroxysmal atrial flutter.  We will discontinue lisinopril/hydrochlorothiazide.  Start Toprol-XL in the morning and lisinopril only  in the evening.  We will hold HCTZ for now.   Currently managed by primary care provider.  Non-insulin-dependent diabetes mellitus type 2: Educated importance of glycemic control.  Currently managed by primary care provider.  Hyperlipidemia:  Recommend a goal LDL of less than 70 mg/dL.  Continue statin therapy.  Does not endorse myalgias.  Currently managed by primary care provider.  Former smoker: Educated on the importance of continued smoking cessation.   FINAL MEDICATION LIST END OF ENCOUNTER: Meds ordered this encounter  Medications  . lisinopril (ZESTRIL) 20 MG tablet    Sig: Take 1 tablet (20 mg total) by mouth every evening.    Dispense:  90 tablet    Refill:  0  . metoprolol succinate (TOPROL XL) 25 MG 24 hr tablet    Sig: Take 1 tablet (25 mg total) by mouth in the morning.    Dispense:  30 tablet    Refill:  0    Medications Discontinued During This Encounter  Medication Reason  . liothyronine (CYTOMEL) 5 MCG tablet Error  . aspirin EC 81 MG tablet Patient Preference  . lisinopril-hydrochlorothiazide (PRINZIDE,ZESTORETIC)  20-25 MG tablet Discontinued by provider     Current Outpatient Medications:  .  albuterol (PROVENTIL HFA;VENTOLIN HFA) 108 (90 Base) MCG/ACT inhaler, Inhale into the lungs every 6 (six) hours as needed for wheezing or shortness of breath., Disp: , Rfl:  .  Cholecalciferol (VITAMIN D PO), Take 250 mg by mouth daily., Disp: , Rfl:  .  CINNAMON PO, Take 250 mg by mouth daily., Disp: , Rfl:  .  Cyanocobalamin (VITAMIN B 12 PO), Take 1 tablet by mouth daily at 6 (six) AM., Disp: , Rfl:  .  levothyroxine (SYNTHROID, LEVOTHROID) 100 MCG tablet, TK 1 T PO QD, Disp: , Rfl: 1 .  lisinopril (ZESTRIL) 20 MG tablet, Take 1 tablet (20 mg total) by mouth every evening., Disp: 90 tablet, Rfl: 0 .  metFORMIN (GLUCOPHAGE-XR) 750 MG 24 hr tablet, TK 1 T PO QD WITH THE EVE MEAL, Disp: , Rfl: 0 .  metoprolol succinate (TOPROL XL) 25 MG 24 hr tablet, Take 1 tablet (25 mg total) by mouth in the morning., Disp: 30 tablet, Rfl: 0 .  Multiple Vitamins-Minerals (ICAPS AREDS 2) CAPS, Take by mouth daily., Disp: , Rfl:  .  sildenafil (VIAGRA) 50 MG tablet, Take 60 mg by mouth daily as needed for erectile dysfunction., Disp: , Rfl:  .  simvastatin (ZOCOR) 20 MG tablet, TK 1 T PO ONCE D IN THE EVENING, Disp: , Rfl: 3 .  XARELTO 20 MG TABS tablet, Take 20 mg by mouth daily., Disp: , Rfl:   Current Facility-Administered Medications:  .  0.9 %  sodium chloride infusion, 500 mL, Intravenous, Once, Danis, Kirke Corin, MD  Orders Placed This Encounter  Procedures  . PCV MYOCARDIAL PERFUSION WITH LEXISCAN  . EKG 12-Lead  . PCV ECHOCARDIOGRAM COMPLETE    There are no Patient Instructions on file for this visit.   --Continue cardiac medications as reconciled in final medication list. --Return in about 6 weeks (around 06/15/2021) for Follow up, Atrial flutter, Review test results. Or sooner if needed. --Continue follow-up with your primary care physician regarding the management of your other chronic comorbid  conditions.  Patient's questions and concerns were addressed to his satisfaction. He voices understanding of the instructions provided during this encounter.   This note was created using a voice recognition software as a result there may be grammatical errors inadvertently enclosed that do not reflect the nature of this encounter. Every attempt is made to correct such errors.  Rex Kras, [place], [facility]  Pager: [phone] Office: [phone]

## 2021-05-04 ENCOUNTER — Other Ambulatory Visit: Payer: Self-pay

## 2021-05-04 ENCOUNTER — Encounter: Payer: Self-pay | Admitting: Cardiology

## 2021-05-04 ENCOUNTER — Ambulatory Visit: Payer: Medicare Other | Admitting: Cardiology

## 2021-05-04 VITALS — BP 124/81 | HR 70 | Temp 98.3°F | Resp 16 | Ht 71.0 in | Wt 199.8 lb

## 2021-05-04 DIAGNOSIS — I10 Essential (primary) hypertension: Secondary | ICD-10-CM | POA: Diagnosis not present

## 2021-05-04 DIAGNOSIS — I4892 Unspecified atrial flutter: Secondary | ICD-10-CM

## 2021-05-04 DIAGNOSIS — E039 Hypothyroidism, unspecified: Secondary | ICD-10-CM

## 2021-05-04 DIAGNOSIS — E782 Mixed hyperlipidemia: Secondary | ICD-10-CM

## 2021-05-04 DIAGNOSIS — Z7901 Long term (current) use of anticoagulants: Secondary | ICD-10-CM

## 2021-05-04 DIAGNOSIS — Z87891 Personal history of nicotine dependence: Secondary | ICD-10-CM

## 2021-05-04 DIAGNOSIS — E1165 Type 2 diabetes mellitus with hyperglycemia: Secondary | ICD-10-CM

## 2021-05-04 DIAGNOSIS — N529 Male erectile dysfunction, unspecified: Secondary | ICD-10-CM

## 2021-05-04 DIAGNOSIS — Z7984 Long term (current) use of oral hypoglycemic drugs: Secondary | ICD-10-CM | POA: Diagnosis not present

## 2021-05-04 MED ORDER — METOPROLOL SUCCINATE ER 25 MG PO TB24: 25.0000 mg | ORAL_TABLET | Freq: Every morning | ORAL | 0 refills | Status: DC

## 2021-05-04 MED ORDER — LISINOPRIL 20 MG PO TABS: 20.0000 mg | ORAL_TABLET | Freq: Every evening | ORAL | 0 refills | Status: DC

## 2021-05-17 ENCOUNTER — Ambulatory Visit: Payer: Medicare Other

## 2021-05-17 ENCOUNTER — Other Ambulatory Visit: Payer: Self-pay

## 2021-05-17 DIAGNOSIS — I4892 Unspecified atrial flutter: Secondary | ICD-10-CM | POA: Diagnosis not present

## 2021-05-28 ENCOUNTER — Ambulatory Visit: Payer: Medicare Other | Admitting: Cardiology

## 2021-05-28 ENCOUNTER — Encounter: Payer: Self-pay | Admitting: Cardiology

## 2021-05-28 ENCOUNTER — Other Ambulatory Visit: Payer: Self-pay

## 2021-05-28 VITALS — BP 168/90 | HR 64 | Resp 16 | Ht 71.0 in | Wt 203.0 lb

## 2021-05-28 DIAGNOSIS — E1165 Type 2 diabetes mellitus with hyperglycemia: Secondary | ICD-10-CM | POA: Diagnosis not present

## 2021-05-28 DIAGNOSIS — E782 Mixed hyperlipidemia: Secondary | ICD-10-CM

## 2021-05-28 DIAGNOSIS — I10 Essential (primary) hypertension: Secondary | ICD-10-CM | POA: Diagnosis not present

## 2021-05-28 DIAGNOSIS — N529 Male erectile dysfunction, unspecified: Secondary | ICD-10-CM

## 2021-05-28 DIAGNOSIS — E039 Hypothyroidism, unspecified: Secondary | ICD-10-CM | POA: Diagnosis not present

## 2021-05-28 DIAGNOSIS — Z7901 Long term (current) use of anticoagulants: Secondary | ICD-10-CM

## 2021-05-28 DIAGNOSIS — Z87891 Personal history of nicotine dependence: Secondary | ICD-10-CM

## 2021-05-28 DIAGNOSIS — I4892 Unspecified atrial flutter: Secondary | ICD-10-CM

## 2021-05-28 NOTE — Progress Notes (Signed)
Date:  05/28/2021   ID:  Isaac Clark, DOB August 17, 1941, MRN [id number]  PCP:  Isaac Seashore, MD  Cardiologist:  Isaac Kras, DO, [facility] (established care 05/04/2021)  Date: 05/28/21 Last Office Visit: 05/04/2021  Chief Complaint  Patient presents with   Paroxysmal atrial flutter   Follow-up   Results    HPI  Isaac Clark is a 80 y.o. male who presents to the office with a chief complaint of " management of atrial flutter and review test results." Patient's past medical history and cardiovascular risk factors include: Hypertension, hyperlipidemia, erectile dysfunction, non-insulin-dependent diabetes mellitus type 2, former smoker, advanced age.  He is referred to the office at the request of Isaac Seashore, MD for evaluation of atrial flutter.  Patient was experiencing palpitations and feeling tired, fatigue.  Shortly thereafter based on his smart watch was noted to have irregular heart rate, possible atrial fibrillation.  He followed up with PCP and was diagnosed with atrial flutter and referred to cardiology for further evaluation and management.  At the last office visit initiated Toprol-XL for rate control strategy.  He was already on oral anticoagulation given his CHA2DS2-VASc score.  Patient does not endorse any evidence of bleeding.  Patient states that he is generalized tired/fatigue has improved significantly.  Due to him feeling tired and fatigue and the initiation of Toprol-XL I recommended discontinuation of ARB/hydrochlorothiazide.  However, the patient's blood pressure is elevated at today's office visit.  Reviewed the results of the echo and stress test with the patient at today's office visit and noted below for further reference.  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  ALLERGIES: Allergies  Allergen Reactions   Zithromax [Azithromycin] Diarrhea    MEDICATION LIST PRIOR TO  VISIT: Current Meds  Medication Sig   albuterol (PROVENTIL HFA;VENTOLIN HFA) 108 (90 Base) MCG/ACT inhaler Inhale into the lungs every 6 (six) hours as needed for wheezing or shortness of breath.   Cholecalciferol (VITAMIN D PO) Take 250 mg by mouth daily.   CINNAMON PO Take 250 mg by mouth daily.   Cyanocobalamin (VITAMIN B 12 PO) Take 1 tablet by mouth daily at 6 (six) AM.   levothyroxine (SYNTHROID, LEVOTHROID) 100 MCG tablet TK 1 T PO QD   lisinopril (ZESTRIL) 20 MG tablet Take 1 tablet (20 mg total) by mouth every evening.   metFORMIN (GLUCOPHAGE-XR) 750 MG 24 hr tablet TK 1 T PO QD WITH THE EVE MEAL   metoprolol succinate (TOPROL XL) 25 MG 24 hr tablet Take 1 tablet (25 mg total) by mouth in the morning.   Multiple Vitamins-Minerals (ICAPS AREDS 2) CAPS Take by mouth daily.   sildenafil (VIAGRA) 50 MG tablet Take 60 mg by mouth daily as needed for erectile dysfunction.   simvastatin (ZOCOR) 20 MG tablet TK 1 T PO ONCE D IN THE EVENING   XARELTO 20 MG TABS tablet Take 20 mg by mouth daily.   Current Facility-Administered Medications for the 05/28/21 encounter (Office Visit) with Isaac Kras, DO  Medication   0.9 %  sodium chloride infusion     PAST MEDICAL HISTORY: Past Medical History:  Diagnosis Date   Allergy    Arthritis    mild   Asthma    Atrial flutter ([place])    Cancer ([place])    skin   Diabetes mellitus without complication ([place])    Hyperlipidemia    Hypertension    Thyroid disease  PAST SURGICAL HISTORY: Past Surgical History:  Procedure Laterality Date   COLONOSCOPY     INGUINAL HERNIA REPAIR Bilateral    MEDIAL PARTIAL KNEE REPLACEMENT Left    shoulder impingement surgery Right    TONSILLECTOMY      FAMILY HISTORY: The patient family history includes Heart disease in his father and mother; Hypertension in his father; Stroke in his mother.  SOCIAL HISTORY:  The patient  reports that he has quit smoking. His smoking use included cigarettes. He has a  9.00 pack-year smoking history. He has never used smokeless tobacco. He reports current alcohol use of about 7.0 standard drinks of alcohol per week. He reports that he does not use drugs.  REVIEW OF SYSTEMS: Review of Systems  Constitutional: Negative for chills, fever and malaise/fatigue.  HENT:  Negative for hoarse voice and nosebleeds.   Eyes:  Negative for discharge, double vision and pain.  Cardiovascular:  Negative for chest pain, claudication, dyspnea on exertion, leg swelling, near-syncope, orthopnea, palpitations, paroxysmal nocturnal dyspnea and syncope.  Respiratory:  Negative for hemoptysis and shortness of breath.   Musculoskeletal:  Negative for muscle cramps and myalgias.  Gastrointestinal:  Negative for abdominal pain, constipation, diarrhea, hematemesis, hematochezia, melena, nausea and vomiting.  Neurological:  Negative for dizziness and light-headedness.   PHYSICAL EXAM: Vitals with BMI 05/28/2021 05/28/2021 05/04/2021  Height - _0  _1   Weight - 203 lbs 199 lbs 13 oz  BMI - 60.73 71.06  Systolic 269 485 462  Diastolic 90 89 81  Pulse 64 70 70    CONSTITUTIONAL: Well-developed and well-nourished. No acute distress.  SKIN: Skin is warm and dry. No rash noted. No cyanosis. No pallor. No jaundice HEAD: Normocephalic and atraumatic.  EYES: No scleral icterus MOUTH/THROAT: Moist oral membranes.  NECK: No JVD present. No thyromegaly noted. No carotid bruits  LYMPHATIC: No visible cervical adenopathy.  CHEST Normal respiratory effort. No intercostal retractions  LUNGS: Clear to auscultation bilaterally.  No stridor. No wheezes. No rales.  CARDIOVASCULAR: Regular rate and rhythm, positive S1-S2, no murmurs rubs or gallops appreciated ABDOMINAL: Soft, nontender, nondistended, positive bowel sounds in all 4 quadrants, no apparent ascites.  EXTREMITIES: No peripheral edema  HEMATOLOGIC: No significant bruising NEUROLOGIC: Oriented to person, place, and time. Nonfocal.  Normal muscle tone.  PSYCHIATRIC: Normal mood and affect. Normal behavior. Cooperative  CARDIAC DATABASE: EKG: 04/19/2021 provided by PCP: Atrial flutter, 86 bpm without underlying ischemia or injury pattern. 05/04/2021: NSR, 69bpm, normal axis, without underlying injury pattern.  Echocardiogram: 05/17/2021: Left ventricle cavity is normal in size. Mild concentric hypertrophy of the left ventricle. Normal global wall motion. Normal LV systolic function with EF 68%. Normal diastolic filling pattern. Moderate (Grade II) mitral regurgitation. Mild tricuspid regurgitation. Estimated pulmonary artery systolic pressure 23 mmHg.   Stress Testing: Exercise tetrofosmin stress test  05/18/2021: Normal ECG stress. The patient exercised for 5 minutes and 44 seconds of a Bruce protocol, achieving approximately 7.05 METs.  achieved 112% of MPHR. Accelerate heart rate response. Hypertensive BP response. Peak BP 216/120 mm Hg. Dyspnea and recovery chest pain noted during exercise stress testing. Rare PVC. Myocardial perfusion is normal. Overall LV systolic function is normal without regional wall motion abnormalities. Stress LV EF: 69%. No previous exam available for comparison. Low risk.  Heart Catheterization: None  LABORATORY DATA: No flowsheet data found.  No flowsheet data found.  Lipid Panel  No results found for: CHOL, TRIG, HDL, CHOLHDL, VLDL, LDLCALC, LDLDIRECT, LABVLDL  No components found for: NTPROBNP  No results for input(s): PROBNP in the last 8760 hours. No results for input(s): TSH in the last 8760 hours.  BMP No results for input(s): NA, K, CL, CO2, GLUCOSE, BUN, CREATININE, CALCIUM, GFRNONAA, GFRAA in the last 8760 hours.  HEMOGLOBIN A1C No results found for: HGBA1C, MPG   External Labs:  Date Collected: 04/19/2021 , information obtained by PCP.  Potassium: 4.4 Creatinine 1.20 mg/dL. eGFR: 58 mL/min per 1.73 m Hemoglobin: 13.9 g/dL and hematocrit: 41.8 % AST: 18 ,  ALT: 24 , alkaline phosphatase: 79  TSH: 1.48   Date Collected: 02/08/2021 Lipid profile: Total cholesterol 163 , triglycerides 176 , HDL 62 , LDL 72 Hemoglobin A1c:   IMPRESSION:    ICD-10-CM   1. Paroxysmal atrial flutter (HCC)  I48.92     2. Long term (current) use of anticoagulants  Z79.01 Hemoglobin and hematocrit, blood    Basic metabolic panel    3. Benign hypertension  I10     4. Mixed hyperlipidemia  E78.2     5. Hypothyroidism, unspecified type  E03.9     6. Erectile dysfunction, unspecified erectile dysfunction type  N52.9     7. Type 2 diabetes mellitus with hyperglycemia, without long-term current use of insulin (HCC)  E11.65     8. Former smoker  Z87.891        RECOMMENDATIONS: Isaac Clark is a 80 y.o. male whose past medical history and cardiac risk factors include: Hypertension, hyperlipidemia, erectile dysfunction, non-insulin-dependent diabetes mellitus type 2, former smoker, advanced age.  Paroxysmal atrial flutter: Rate control: Start Toprol-XL 25 mg p.o. every morning Rhythm control: N/A Thromboembolic prophylaxis: Xarelto CHA2DS2-VASc SCORE is 4 (age>75, DM, HTN) which correlates to 4.8 % risk of stroke per year.  Results of the echocardiogram and stress test reviewed with him.  No additional testing warranted at this time. If patient has recurrence of atrial flutter will consider EP consultation for atrial flutter ablation evaluation.  Long-term oral anticoagulation: Indication: Paroxysmal atrial flutter. CHA2DS2-VASc SCORE is 4. Reiterated the risks, benefits, alternatives to oral anticoagulation.   Check BMP and hemoglobin hematocrit prior to the next office visit.  Benign essential hypertension: Office blood pressure not at goal.  I have asked him to take an additional lisinopril 20 mg p.o. nightly once he goes home. Starting tomorrow he can discontinue lisinopril 20 mg p.o. daily and restart the combination therapy with lisinopril/HCTZ.   He will follow-up with PCP.   Currently managed by primary care provider.  Non-insulin-dependent diabetes mellitus type 2: Educated importance of glycemic control.  Currently managed by primary care provider.  Hyperlipidemia: Recommend a goal LDL of less than 70 mg/dL.  Continue statin therapy.  Does not endorse myalgias.  Currently managed by primary care provider.  Former smoker: Educated on the importance of continued smoking cessation.  FINAL MEDICATION LIST END OF ENCOUNTER: No orders of the defined types were placed in this encounter.   There are no discontinued medications.    Current Outpatient Medications:    albuterol (PROVENTIL HFA;VENTOLIN HFA) 108 (90 Base) MCG/ACT inhaler, Inhale into the lungs every 6 (six) hours as needed for wheezing or shortness of breath., Disp: , Rfl:    Cholecalciferol (VITAMIN D PO), Take 250 mg by mouth daily., Disp: , Rfl:    CINNAMON PO, Take 250 mg by mouth daily., Disp: , Rfl:    Cyanocobalamin (VITAMIN B 12 PO), Take 1 tablet by mouth daily at 6 (six) AM., Disp: , Rfl:    levothyroxine (SYNTHROID,  LEVOTHROID) 100 MCG tablet, TK 1 T PO QD, Disp: , Rfl: 1   lisinopril (ZESTRIL) 20 MG tablet, Take 1 tablet (20 mg total) by mouth every evening., Disp: 90 tablet, Rfl: 0   metFORMIN (GLUCOPHAGE-XR) 750 MG 24 hr tablet, TK 1 T PO QD WITH THE EVE MEAL, Disp: , Rfl: 0   metoprolol succinate (TOPROL XL) 25 MG 24 hr tablet, Take 1 tablet (25 mg total) by mouth in the morning., Disp: 30 tablet, Rfl: 0   Multiple Vitamins-Minerals (ICAPS AREDS 2) CAPS, Take by mouth daily., Disp: , Rfl:    sildenafil (VIAGRA) 50 MG tablet, Take 60 mg by mouth daily as needed for erectile dysfunction., Disp: , Rfl:    simvastatin (ZOCOR) 20 MG tablet, TK 1 T PO ONCE D IN THE EVENING, Disp: , Rfl: 3   XARELTO 20 MG TABS tablet, Take 20 mg by mouth daily., Disp: , Rfl:   Current Facility-Administered Medications:    0.9 %  sodium chloride infusion, 500 mL, Intravenous,  Once, Danis, Kirke Corin, MD  Orders Placed This Encounter  Procedures   Hemoglobin and hematocrit, blood   Basic metabolic panel    There are no Patient Instructions on file for this visit.   --Continue cardiac medications as reconciled in final medication list. --Return in about 6 months (around 11/27/2021) for Follow up, Atrial flutter. Or sooner if needed. --Continue follow-up with your primary care physician regarding the management of your other chronic comorbid conditions.  Patient's questions and concerns were addressed to his satisfaction. He voices understanding of the instructions provided during this encounter.   This note was created using a voice recognition software as a result there may be grammatical errors inadvertently enclosed that do not reflect the nature of this encounter. Every attempt is made to correct such errors.  Isaac Clark, [place], [facility]  Pager: [phone] Office: [phone]

## 2021-06-04 ENCOUNTER — Other Ambulatory Visit: Payer: Self-pay | Admitting: Cardiology

## 2021-06-04 DIAGNOSIS — I4892 Unspecified atrial flutter: Secondary | ICD-10-CM

## 2021-07-04 DIAGNOSIS — Z7984 Long term (current) use of oral hypoglycemic drugs: Secondary | ICD-10-CM | POA: Diagnosis not present

## 2021-07-04 DIAGNOSIS — E1165 Type 2 diabetes mellitus with hyperglycemia: Secondary | ICD-10-CM | POA: Diagnosis not present

## 2021-07-04 DIAGNOSIS — E782 Mixed hyperlipidemia: Secondary | ICD-10-CM | POA: Diagnosis not present

## 2021-07-04 DIAGNOSIS — I10 Essential (primary) hypertension: Secondary | ICD-10-CM | POA: Diagnosis not present

## 2021-07-05 ENCOUNTER — Encounter: Payer: Self-pay | Admitting: Gastroenterology

## 2021-07-05 ENCOUNTER — Other Ambulatory Visit: Payer: Self-pay | Admitting: Cardiology

## 2021-07-05 DIAGNOSIS — I4892 Unspecified atrial flutter: Secondary | ICD-10-CM

## 2021-08-02 ENCOUNTER — Other Ambulatory Visit: Payer: Self-pay | Admitting: Cardiology

## 2021-08-02 DIAGNOSIS — I4892 Unspecified atrial flutter: Secondary | ICD-10-CM

## 2021-08-04 DIAGNOSIS — I10 Essential (primary) hypertension: Secondary | ICD-10-CM | POA: Diagnosis not present

## 2021-08-04 DIAGNOSIS — Z7984 Long term (current) use of oral hypoglycemic drugs: Secondary | ICD-10-CM | POA: Diagnosis not present

## 2021-08-04 DIAGNOSIS — E1165 Type 2 diabetes mellitus with hyperglycemia: Secondary | ICD-10-CM | POA: Diagnosis not present

## 2021-08-04 DIAGNOSIS — E782 Mixed hyperlipidemia: Secondary | ICD-10-CM | POA: Diagnosis not present

## 2021-08-10 DIAGNOSIS — E782 Mixed hyperlipidemia: Secondary | ICD-10-CM | POA: Diagnosis not present

## 2021-08-10 DIAGNOSIS — E1165 Type 2 diabetes mellitus with hyperglycemia: Secondary | ICD-10-CM | POA: Diagnosis not present

## 2021-08-16 DIAGNOSIS — E039 Hypothyroidism, unspecified: Secondary | ICD-10-CM | POA: Diagnosis not present

## 2021-08-16 DIAGNOSIS — D692 Other nonthrombocytopenic purpura: Secondary | ICD-10-CM | POA: Diagnosis not present

## 2021-08-16 DIAGNOSIS — E1165 Type 2 diabetes mellitus with hyperglycemia: Secondary | ICD-10-CM | POA: Diagnosis not present

## 2021-08-16 DIAGNOSIS — I4892 Unspecified atrial flutter: Secondary | ICD-10-CM | POA: Diagnosis not present

## 2021-08-16 DIAGNOSIS — I10 Essential (primary) hypertension: Secondary | ICD-10-CM | POA: Diagnosis not present

## 2021-08-16 DIAGNOSIS — Z23 Encounter for immunization: Secondary | ICD-10-CM | POA: Diagnosis not present

## 2021-08-16 DIAGNOSIS — N182 Chronic kidney disease, stage 2 (mild): Secondary | ICD-10-CM | POA: Diagnosis not present

## 2021-08-16 DIAGNOSIS — E782 Mixed hyperlipidemia: Secondary | ICD-10-CM | POA: Diagnosis not present

## 2021-09-01 ENCOUNTER — Other Ambulatory Visit: Payer: Self-pay | Admitting: Cardiology

## 2021-09-01 DIAGNOSIS — I4892 Unspecified atrial flutter: Secondary | ICD-10-CM

## 2021-10-04 ENCOUNTER — Ambulatory Visit: Payer: Medicare Other | Admitting: Gastroenterology

## 2021-10-04 ENCOUNTER — Telehealth: Payer: Self-pay

## 2021-10-04 ENCOUNTER — Encounter: Payer: Self-pay | Admitting: Gastroenterology

## 2021-10-04 VITALS — BP 140/90 | HR 76 | Ht 70.25 in | Wt 197.1 lb

## 2021-10-04 DIAGNOSIS — Z7902 Long term (current) use of antithrombotics/antiplatelets: Secondary | ICD-10-CM

## 2021-10-04 DIAGNOSIS — Z86010 Personal history of colonic polyps: Secondary | ICD-10-CM

## 2021-10-04 MED ORDER — PLENVU 140 G PO SOLR: 140.0000 g | ORAL | 0 refills | Status: DC

## 2021-10-04 NOTE — Patient Instructions (Signed)
If you are age 78 or older, your body mass index should be between 23-30. Your Body mass index is 28.08 kg/m. If this is out of the aforementioned range listed, please consider follow up with your Primary Care Provider.  If you are age 72 or younger, your body mass index should be between 19-25. Your Body mass index is 28.08 kg/m. If this is out of the aformentioned range listed, please consider follow up with your Primary Care Provider.   ________________________________________________________  The [facility] providers would like to encourage you to use [facility] to communicate with providers for non-urgent requests or questions.  Due to long hold times on the telephone, sending your provider a message by [facility] may be a faster and more efficient way to get a response.  Please allow 48 business hours for a response.  Please remember that this is for non-urgent requests.  _______________________________________________________  Dennis Bast have been scheduled for a colonoscopy. Please follow written instructions given to you at your visit today.  Please pick up your prep supplies at the pharmacy within the next 1-3 days. If you use inhalers (even only as needed), please bring them with you on the day of your procedure.   It was a pleasure to see you today!  Thank you for trusting me with your gastrointestinal care!

## 2021-10-04 NOTE — Progress Notes (Signed)
[facility] Consult Note:  History: Isaac Clark 10/04/2021  Referring provider: Merrilee Seashore, MD  Reason for consult/chief complaint: hx of colon polyps (To discuss having colonoscopy)   Subjective  HPI:  This is a very pleasant 80 year old man known to me from a colonoscopy in April 2019 for positive Cologuard.  Prep initially fair, improved with lavage.  2 TVA polyps (8 and 12 mm) removed.  3-year recall recommended.  He was seen today to discuss surveillance colonoscopy and review his medical history since he has atrial arrhythmia on oral anticoagulation.  Most recent cardiology note reviewed as noted below.  Isaac Clark denies chest pain or dyspnea and he stays active.  He has not had any bleeding complications from his anticoagulation, says his bowel habits are regular without rectal bleeding and denies nausea vomiting dysphagia or weight loss  ROS:  Review of Systems  Constitutional:  Negative for appetite change and unexpected weight change.  HENT:  Negative for mouth sores and voice change.   Eyes:  Negative for pain and redness.  Respiratory:  Negative for cough and shortness of breath.   Cardiovascular:  Negative for chest pain and palpitations.  Genitourinary:  Negative for dysuria and hematuria.  Musculoskeletal:  Negative for arthralgias and myalgias.  Skin:  Negative for pallor and rash.  Neurological:  Negative for weakness and headaches.  Hematological:  Negative for adenopathy.  Occ'l palpitations, less so after some changes to his medicines by cardiology   Past Medical History: Past Medical History:  Diagnosis Date   Adenomatous colon polyp    Tubularvillous   Allergy    Arthritis    mild   Asthma    Atrial fibrillation ([place])    Atrial flutter ([place])    Cancer ([place])    skin   Diabetes mellitus without complication ([place])    Hyperlipidemia    Hypertension    Thyroid disease    Last Cardiology note 05/28/21 reviewed    Past Surgical History: Past Surgical History:  Procedure Laterality Date   COLONOSCOPY     INGUINAL HERNIA REPAIR Bilateral    MEDIAL PARTIAL KNEE REPLACEMENT Left    shoulder impingement surgery Right    TONSILLECTOMY       Family History: Family History  Problem Relation Age of Onset   Stroke Mother    Heart disease Mother    Hypertension Father    Heart disease Father    Colon cancer Neg Hx    Colon polyps Neg Hx    Esophageal cancer Neg Hx    Rectal cancer Neg Hx    Stomach cancer Neg Hx     Social History: Social History   Socioeconomic History   Marital status: Married    Spouse name: Not on file   Number of children: 2   Years of education: Not on file   Highest education level: Not on file  Occupational History   Not on file  Tobacco Use   Smoking status: Former    Packs/day: 1.00    Years: 9.00    Pack years: 9.00    Types: Cigarettes   Smokeless tobacco: Never  Vaping Use   Vaping Use: Never used  Substance and Sexual Activity   Alcohol use: Yes    Alcohol/week: 7.0 standard drinks    Types: 7 Glasses of wine per week    Comment: daily wine or liquor daily 1 drink    Drug use: Never   Sexual activity: Not on file  Other Topics Concern   Not on file  Social History Narrative   Not on file   Social Determinants of Health   Financial Resource Strain: Not on file  Food Insecurity: Not on file  Transportation Needs: Not on file  Physical Activity: Not on file  Stress: Not on file  Social Connections: Not on file    Allergies: Allergies  Allergen Reactions   Zithromax [Azithromycin] Diarrhea    Outpatient Meds: Current Outpatient Medications  Medication Sig Dispense Refill   albuterol (PROVENTIL HFA;VENTOLIN HFA) 108 (90 Base) MCG/ACT inhaler Inhale into the lungs every 6 (six) hours as needed for wheezing or shortness of breath.     Cholecalciferol (VITAMIN D PO) Take 250 mg by mouth daily.     CINNAMON PO Take 250 mg by mouth  daily.     Cyanocobalamin (VITAMIN B 12 PO) Take 1 tablet by mouth daily at 6 (six) AM.     levothyroxine (SYNTHROID, LEVOTHROID) 100 MCG tablet Take 100 mcg by mouth daily before breakfast.  1   lisinopril-hydrochlorothiazide (ZESTORETIC) 10-12.5 MG tablet Take 1 tablet by mouth daily.     metFORMIN (GLUCOPHAGE-XR) 750 MG 24 hr tablet Take 750 mg by mouth daily.  0   metoprolol succinate (TOPROL-XL) 25 MG 24 hr tablet TAKE ONE TABLET BY MOUTH EVERY MORNING 90 tablet 2   Multiple Vitamins-Minerals (ICAPS AREDS 2) CAPS Take by mouth daily.     PEG-KCl-NaCl-NaSulf-Na Asc-C (PLENVU) 140 g SOLR Take 140 g by mouth as directed. Manufacturer's coupon Universal coupon code:BIN: P2366821; GROUP: NT61443154; PCN: [id number]; ID: [id number]; PAY NO MORE $50 1 each 0   sildenafil (VIAGRA) 50 MG tablet Take 60 mg by mouth daily as needed for erectile dysfunction.     simvastatin (ZOCOR) 20 MG tablet Take 20 mg by mouth daily.  3   XARELTO 20 MG TABS tablet Take 20 mg by mouth daily.     Current Facility-Administered Medications  Medication Dose Route Frequency Provider Last Rate Last Admin   0.9 %  sodium chloride infusion  500 mL Intravenous Once Danis, Kirke Corin, MD          ___________________________________________________________________ Objective   Exam:  BP 140/90 (BP Location: Left Arm, Patient Position: Sitting, Cuff Size: Normal)   Pulse 76   Ht 5' 10.25" (1.784 m) Comment: height measured without shoes  Wt 197 lb 2 oz (89.4 kg)   BMI 28.08 kg/m  Wt Readings from Last 3 Encounters:  10/04/21 197 lb 2 oz (89.4 kg)  05/28/21 203 lb (92.1 kg)  05/04/21 199 lb 12.8 oz (90.6 kg)    General: Well-appearing, ambulatory, gets on exam table easily Eyes: sclera anicteric, no redness ENT: oral mucosa moist without lesions, no cervical or supraclavicular lymphadenopathy CV: Rhythm irregular, rate controlled, S1/S2, no JVD, no peripheral edema Resp: clear to auscultation bilaterally, normal RR  and effort noted GI: soft, no tenderness, with active bowel sounds. No guarding or palpable organomegaly noted. Skin; warm and dry, no rash or jaundice noted Neuro: awake, alert and oriented x 3. Normal gross motor function and fluent speech  Labs:  Last echocardiogram 05/17/21: nml LVEF, LVH, grade 2 MR  Assessment: Encounter Diagnoses  Name Primary?   Personal history of colonic polyps Yes   Long term (current) use of antithrombotics/antiplatelets     He is appropriate for surveillance colonoscopy.  Risks and benefits reviewed and he was agreeable  The benefits and risks of the planned procedure were described in detail  with the patient or (when appropriate) their health care proxy.  Risks were outlined as including, but not limited to, bleeding, infection, perforation, adverse medication reaction leading to cardiac or pulmonary decompensation, pancreatitis (if ERCP).  The limitation of incomplete mucosal visualization was also discussed.  No guarantees or warranties were given.  Last dose Xarelto in the morning the day prior to the procedure, will clear with cardiology.   (He requested low volume bowel prep such as Suprep or Plenvu even if not covered by insurance)  Thank you for the courtesy of this consult.  Please call me with any questions or concerns.  Nelida Meuse III  CC: Referring provider noted above Also Sunit Tolia, DO

## 2021-10-04 NOTE — Telephone Encounter (Signed)
    Patient Name: Isaac Clark  DOB: Jun 21, 1941 MRN: [id number]  Primary Cardiologist: Dr. Terri Skains with [facility] cardiovascular  Chart reviewed as part of pre-operative protocol coverage.   Patient follows with [facility] for general cardiology care.  Will defer recommendations for holding Xarelto to Dr. Terri Skains.   I will route this recommendation to the requesting party via Epic fax function and remove from pre-op pool.  Please call with questions.  Abigail Butts, PA-C 10/04/2021, 5:48 PM

## 2021-10-04 NOTE — Telephone Encounter (Signed)
[facility] Pre-operative Risk Assessment     Request for surgical clearance:     Endoscopy Procedure  What type of surgery is being performed?     Colonoscopy   When is this surgery scheduled?     12-15-2021  What type of clearance is required ?   Pharmacy  Are there any medications that need to be held prior to surgery and how long? Yes, Xarelto   Practice name and name of physician performing surgery?      [facility]  What is your office phone and fax number?      Phone- [phone]  [phone]  Anesthesia type (None, local, MAC, general) ?       MAC

## 2021-10-08 NOTE — Telephone Encounter (Signed)
Clearance request sent to dr Terri Skains

## 2021-10-25 NOTE — Telephone Encounter (Signed)
Patient advised that he has been given clearance to hold Xarelto 2 days prior to colonoscopy scheduled for 12-15-2021.  Patient advised to take last dose of Xarelto on 12-12-21, and he will be advised when to restart Xarelto by Dr Loletha Carrow after the procedure.  Patient agreed to plan and verbalized understanding.  No further questions.

## 2021-11-11 DIAGNOSIS — H353132 Nonexudative age-related macular degeneration, bilateral, intermediate dry stage: Secondary | ICD-10-CM | POA: Diagnosis not present

## 2021-11-11 DIAGNOSIS — H31093 Other chorioretinal scars, bilateral: Secondary | ICD-10-CM | POA: Diagnosis not present

## 2021-11-11 DIAGNOSIS — E119 Type 2 diabetes mellitus without complications: Secondary | ICD-10-CM | POA: Diagnosis not present

## 2021-11-11 DIAGNOSIS — H25813 Combined forms of age-related cataract, bilateral: Secondary | ICD-10-CM | POA: Diagnosis not present

## 2021-11-11 DIAGNOSIS — H02834 Dermatochalasis of left upper eyelid: Secondary | ICD-10-CM | POA: Diagnosis not present

## 2021-11-11 DIAGNOSIS — H43812 Vitreous degeneration, left eye: Secondary | ICD-10-CM | POA: Diagnosis not present

## 2021-11-11 DIAGNOSIS — H02831 Dermatochalasis of right upper eyelid: Secondary | ICD-10-CM | POA: Diagnosis not present

## 2021-12-05 HISTORY — PX: CATARACT EXTRACTION: SUR2

## 2021-12-15 ENCOUNTER — Ambulatory Visit (AMBULATORY_SURGERY_CENTER): Payer: Medicare Other | Admitting: Gastroenterology

## 2021-12-15 ENCOUNTER — Encounter: Payer: Self-pay | Admitting: Gastroenterology

## 2021-12-15 ENCOUNTER — Other Ambulatory Visit: Payer: Self-pay

## 2021-12-15 VITALS — BP 116/80 | HR 66 | Temp 96.0°F | Resp 14 | Ht 70.0 in | Wt 197.0 lb

## 2021-12-15 DIAGNOSIS — Z86010 Personal history of colonic polyps: Secondary | ICD-10-CM | POA: Diagnosis not present

## 2021-12-15 DIAGNOSIS — K635 Polyp of colon: Secondary | ICD-10-CM | POA: Diagnosis not present

## 2021-12-15 DIAGNOSIS — D123 Benign neoplasm of transverse colon: Secondary | ICD-10-CM

## 2021-12-15 MED ORDER — SODIUM CHLORIDE 0.9 % IV SOLN: 500.0000 mL | Freq: Once | INTRAVENOUS | Status: DC

## 2021-12-15 NOTE — Progress Notes (Signed)
Called to room to assist during endoscopic procedure.  Patient ID and intended procedure confirmed with present staff. Received instructions for my participation in the procedure from the performing physician.  

## 2021-12-15 NOTE — Op Note (Signed)
[place] Patient Name: Isaac Clark Procedure Date: 12/15/2021 11:21 AM MRN: [id number] Endoscopist: Yoe. Loletha Carrow , MD Age: 81 Referring MD:  Date of Birth: [birth year] Gender: Male Account #: [id number] Procedure:                Colonoscopy Indications:              Surveillance: Personal history of adenomatous                            polyps on last colonoscopy > 3 years ago                           TVA x 2 (8&40mm) 07/2018 Medicines:                Monitored Anesthesia Care Procedure:                Pre-Anesthesia Assessment:                           - Prior to the procedure, a History and Physical                            was performed, and patient medications and                            allergies were reviewed. The patient's tolerance of                            previous anesthesia was also reviewed. The risks                            and benefits of the procedure and the sedation                            options and risks were discussed with the patient.                            All questions were answered, and informed consent                            was obtained. Prior Anticoagulants: The patient has                            taken Xarelto (rivaroxaban), last dose was 2 days                            prior to procedure. ASA Grade Assessment: III - A                            patient with severe systemic disease. After                            reviewing the risks and benefits, the patient was  deemed in satisfactory condition to undergo the                            procedure.                           After obtaining informed consent, the colonoscope                            was passed under direct vision. Throughout the                            procedure, the patient's blood pressure, pulse, and                            oxygen saturations were monitored continuously. The                            CF  [id number] #[id number] was introduced through the anus                            and advanced to the the cecum, identified by                            appendiceal orifice and ileocecal valve. The                            colonoscopy was performed without difficulty. The                            patient tolerated the procedure well. The quality                            of the bowel preparation was good after lavage. The                            ileocecal valve, appendiceal orifice, and rectum                            were photographed. Scope In: 11:41:19 AM Scope Out: 82:50:53 AM Scope Withdrawal Time: 0 hours 12 minutes 3 seconds  Total Procedure Duration: 0 hours 14 minutes 50 seconds  Findings:                 The perianal and digital rectal examinations were                            normal.                           A 4 mm polyp was found in the transverse colon. The                            polyp was semi-sessile. The polyp was removed with  a cold snare. Resection and retrieval were complete.                           A few diverticula were found in the left colon.                           The exam was otherwise without abnormality on                            direct and retroflexion views. Complications:            No immediate complications. Estimated Blood Loss:     Estimated blood loss: none. Estimated blood loss                            was minimal. Impression:               - One 4 mm polyp in the transverse colon, removed                            with a cold snare. Resected and retrieved.                           - Diverticulosis in the left colon.                           - The examination was otherwise normal on direct                            and retroflexion views. Recommendation:           - Patient has a contact number available for                            emergencies. The signs and symptoms of potential                             delayed complications were discussed with the                            patient. Return to normal activities tomorrow.                            Written discharge instructions were provided to the                            patient.                           - Resume previous diet.                           - Continue present medications. Resume Xarelto at                            usual dose tomorrow.                           -  Await pathology results.                           - No repeat surveillance colonoscopy recommended                            due to age, today's findings and current guidelines. Isaac Clark L. Loletha Carrow, MD 12/15/2021 12:05:59 PM This report has been signed electronically.

## 2021-12-15 NOTE — Patient Instructions (Signed)
Thank you for letting us take care of your healthcare needs today. Please see handouts given to you on Polyps. You may resume your Xarelto in the morning.    YOU HAD AN ENDOSCOPIC PROCEDURE TODAY AT [facility]:   Refer to the procedure report that was given to you for any specific questions about what was found during the examination.  If the procedure report does not answer your questions, please call your gastroenterologist to clarify.  If you requested that your care partner not be given the details of your procedure findings, then the procedure report has been included in a sealed envelope for you to review at your convenience later.  YOU SHOULD EXPECT: Some feelings of bloating in the abdomen. Passage of more gas than usual.  Walking can help get rid of the air that was put into your GI tract during the procedure and reduce the bloating. If you had a lower endoscopy (such as a colonoscopy or flexible sigmoidoscopy) you may notice spotting of blood in your stool or on the toilet paper. If you underwent a bowel prep for your procedure, you may not have a normal bowel movement for a few days.  Please Note:  You might notice some irritation and congestion in your nose or some drainage.  This is from the oxygen used during your procedure.  There is no need for concern and it should clear up in a day or so.  SYMPTOMS TO REPORT IMMEDIATELY:  Following lower endoscopy (colonoscopy or flexible sigmoidoscopy):  Excessive amounts of blood in the stool  Significant tenderness or worsening of abdominal pains  Swelling of the abdomen that is new, acute  Fever of 100F or higher  For urgent or emergent issues, a gastroenterologist can be reached at any hour by calling [phone]. Do not use MyChart messaging for urgent concerns.    DIET:  We do recommend a small meal at first, but then you may proceed to your regular diet.  Drink plenty of fluids but you should avoid alcoholic  beverages for 24 hours.  ACTIVITY:  You should plan to take it easy for the rest of today and you should NOT DRIVE or use heavy machinery until tomorrow (because of the sedation medicines used during the test).    FOLLOW UP: Our staff will call the number listed on your records 48-72 hours following your procedure to check on you and address any questions or concerns that you may have regarding the information given to you following your procedure. If we do not reach you, we will leave a message.  We will attempt to reach you two times.  During this call, we will ask if you have developed any symptoms of COVID 19. If you develop any symptoms (ie: fever, flu-like symptoms, shortness of breath, cough etc.) before then, please call [phone].  If you test positive for Covid 19 in the 2 weeks post procedure, please call and report this information to [place].    If any biopsies were taken you will be contacted by phone or by letter within the next 1-3 weeks.  Please call us at [phone] if you have not heard about the biopsies in 3 weeks.    SIGNATURES/CONFIDENTIALITY: You and/or your care partner have signed paperwork which will be entered into your electronic medical record.  These signatures attest to the fact that that the information above on your After Visit Summary has been reviewed and is understood.  Full responsibility of the  confidentiality of this discharge information lies with you and/or your care-partner.

## 2021-12-15 NOTE — Progress Notes (Signed)
A and O x3. Report to RN. Tolerated MAC anesthesia well. 

## 2021-12-15 NOTE — Progress Notes (Signed)
History and Physical:  This patient presents for endoscopic testing for: Encounter Diagnosis  Name Primary?   Personal history of colonic polyps Yes    Office visit 10/04/21 - Hx colon polyps.  On [place] (held for procedure)  ROS: Patient denies chest pain or cough   Past Medical History: Past Medical History:  Diagnosis Date   Adenomatous colon polyp    Tubularvillous   Allergy    Arthritis    mild   Asthma    Atrial fibrillation ([place])    Atrial flutter ([place])    Cancer ([place])    skin   Diabetes mellitus without complication ([place])    Hyperlipidemia    Hypertension    Thyroid disease      Past Surgical History: Past Surgical History:  Procedure Laterality Date   COLONOSCOPY     INGUINAL HERNIA REPAIR Bilateral    MEDIAL PARTIAL KNEE REPLACEMENT Left    shoulder impingement surgery Right    TONSILLECTOMY      Allergies: Allergies  Allergen Reactions   Zithromax [Azithromycin] Diarrhea    Outpatient Meds: Current Outpatient Medications  Medication Sig Dispense Refill   albuterol (PROVENTIL HFA;VENTOLIN HFA) 108 (90 Base) MCG/ACT inhaler Inhale into the lungs every 6 (six) hours as needed for wheezing or shortness of breath.     Cholecalciferol (VITAMIN D PO) Take 250 mg by mouth daily.     CINNAMON PO Take 250 mg by mouth daily.     Cyanocobalamin (VITAMIN B 12 PO) Take 1 tablet by mouth daily at 6 (six) AM.     levothyroxine (SYNTHROID, LEVOTHROID) 100 MCG tablet Take 100 mcg by mouth daily before breakfast.  1   lisinopril-hydrochlorothiazide (ZESTORETIC) 10-12.5 MG tablet Take 1 tablet by mouth daily.     metFORMIN (GLUCOPHAGE-XR) 750 MG 24 hr tablet Take 750 mg by mouth daily.  0   metoprolol succinate (TOPROL-XL) 25 MG 24 hr tablet TAKE ONE TABLET BY MOUTH EVERY MORNING 90 tablet 2   Multiple Vitamins-Minerals (ICAPS AREDS 2) CAPS Take by mouth daily.     simvastatin (ZOCOR) 20 MG tablet Take 20 mg by mouth daily.  3   sildenafil (VIAGRA) 50 MG tablet Take  60 mg by mouth daily as needed for erectile dysfunction.     XARELTO 20 MG TABS tablet Take 20 mg by mouth daily.     Current Facility-Administered Medications  Medication Dose Route Frequency Provider Last Rate Last Admin   0.9 %  sodium chloride infusion  500 mL Intravenous Once Danis, Estill Cotta III, MD       0.9 %  sodium chloride infusion  500 mL Intravenous Once Nelida Meuse III, MD          ___________________________________________________________________ Objective   Exam:  BP (!) 151/85    Pulse 63    Temp (!) 96 F (35.6 C)    Resp (!) 23    Ht 5\' 10"  (1.778 m)    Wt 197 lb (89.4 kg)    SpO2 97%    BMI 28.27 kg/m   CV: RRR without murmur, S1/S2 Resp: clear to auscultation bilaterally, normal RR and effort noted GI: soft, no tenderness, with active bowel sounds.   Assessment: Encounter Diagnosis  Name Primary?   Personal history of colonic polyps Yes     Plan: Colonoscopy  The benefits and risks of the planned procedure were described in detail with the patient or (when appropriate) their health care proxy.  Risks were outlined as  including, but not limited to, bleeding, infection, perforation, adverse medication reaction leading to cardiac or pulmonary decompensation, pancreatitis (if ERCP).  The limitation of incomplete mucosal visualization was also discussed.  No guarantees or warranties were given.    The patient is appropriate for an endoscopic procedure in the ambulatory setting.   - Wilfrid Lund, MD

## 2021-12-17 ENCOUNTER — Telehealth: Payer: Self-pay

## 2021-12-17 NOTE — Telephone Encounter (Signed)
Follow up call, VM obtained and message left. SChaplin, RN,BSN

## 2021-12-17 NOTE — Telephone Encounter (Signed)
°  Follow up Call-  Call back number 12/15/2021  Post procedure Call Back phone  # [phone]  Permission to leave phone message Yes  Some recent data might be hidden     Patient questions:  Do you have a fever, pain , or abdominal swelling? No. Pain Score  0 *  Have you tolerated food without any problems? Yes.    Have you been able to return to your normal activities? Yes.    Do you have any questions about your discharge instructions: Diet   No. Medications  No. Follow up visit  No.  Do you have questions or concerns about your Care? No.  Actions: * If pain score is 4 or above: No action needed, pain <4.  Have you developed a fever since your procedure? no  2.   Have you had an respiratory symptoms (SOB or cough) since your procedure? no  3.   Have you tested positive for COVID 19 since your procedure no  4.   Have you had any family members/close contacts diagnosed with the COVID 19 since your procedure?  no   If yes to any of these questions please route to Joylene John, RN and Joella Prince, RN

## 2021-12-20 ENCOUNTER — Encounter: Payer: Self-pay | Admitting: Gastroenterology

## 2021-12-23 DIAGNOSIS — Z08 Encounter for follow-up examination after completed treatment for malignant neoplasm: Secondary | ICD-10-CM | POA: Diagnosis not present

## 2021-12-23 DIAGNOSIS — L814 Other melanin hyperpigmentation: Secondary | ICD-10-CM | POA: Diagnosis not present

## 2021-12-23 DIAGNOSIS — L821 Other seborrheic keratosis: Secondary | ICD-10-CM | POA: Diagnosis not present

## 2021-12-23 DIAGNOSIS — L309 Dermatitis, unspecified: Secondary | ICD-10-CM | POA: Diagnosis not present

## 2021-12-23 DIAGNOSIS — L538 Other specified erythematous conditions: Secondary | ICD-10-CM | POA: Diagnosis not present

## 2021-12-23 DIAGNOSIS — D485 Neoplasm of uncertain behavior of skin: Secondary | ICD-10-CM | POA: Diagnosis not present

## 2021-12-23 DIAGNOSIS — L218 Other seborrheic dermatitis: Secondary | ICD-10-CM | POA: Diagnosis not present

## 2021-12-23 DIAGNOSIS — R58 Hemorrhage, not elsewhere classified: Secondary | ICD-10-CM | POA: Diagnosis not present

## 2021-12-23 DIAGNOSIS — Z85828 Personal history of other malignant neoplasm of skin: Secondary | ICD-10-CM | POA: Diagnosis not present

## 2021-12-23 DIAGNOSIS — L820 Inflamed seborrheic keratosis: Secondary | ICD-10-CM | POA: Diagnosis not present

## 2021-12-23 DIAGNOSIS — L570 Actinic keratosis: Secondary | ICD-10-CM | POA: Diagnosis not present

## 2021-12-23 DIAGNOSIS — D225 Melanocytic nevi of trunk: Secondary | ICD-10-CM | POA: Diagnosis not present

## 2022-01-28 DIAGNOSIS — H25811 Combined forms of age-related cataract, right eye: Secondary | ICD-10-CM | POA: Diagnosis not present

## 2022-02-09 DIAGNOSIS — H2512 Age-related nuclear cataract, left eye: Secondary | ICD-10-CM | POA: Diagnosis not present

## 2022-02-11 DIAGNOSIS — H25812 Combined forms of age-related cataract, left eye: Secondary | ICD-10-CM | POA: Diagnosis not present

## 2022-02-17 ENCOUNTER — Ambulatory Visit: Payer: Medicare Other | Admitting: Cardiology

## 2022-02-24 DIAGNOSIS — Z0000 Encounter for general adult medical examination without abnormal findings: Secondary | ICD-10-CM | POA: Diagnosis not present

## 2022-02-24 DIAGNOSIS — E1165 Type 2 diabetes mellitus with hyperglycemia: Secondary | ICD-10-CM | POA: Diagnosis not present

## 2022-02-24 DIAGNOSIS — I10 Essential (primary) hypertension: Secondary | ICD-10-CM | POA: Diagnosis not present

## 2022-02-24 DIAGNOSIS — N182 Chronic kidney disease, stage 2 (mild): Secondary | ICD-10-CM | POA: Diagnosis not present

## 2022-02-24 DIAGNOSIS — E782 Mixed hyperlipidemia: Secondary | ICD-10-CM | POA: Diagnosis not present

## 2022-02-24 DIAGNOSIS — E039 Hypothyroidism, unspecified: Secondary | ICD-10-CM | POA: Diagnosis not present

## 2022-03-01 ENCOUNTER — Ambulatory Visit: Payer: Medicare Other | Admitting: Cardiology

## 2022-03-03 DIAGNOSIS — I10 Essential (primary) hypertension: Secondary | ICD-10-CM | POA: Diagnosis not present

## 2022-03-03 DIAGNOSIS — E039 Hypothyroidism, unspecified: Secondary | ICD-10-CM | POA: Diagnosis not present

## 2022-03-03 DIAGNOSIS — E1122 Type 2 diabetes mellitus with diabetic chronic kidney disease: Secondary | ICD-10-CM | POA: Diagnosis not present

## 2022-03-03 DIAGNOSIS — Z23 Encounter for immunization: Secondary | ICD-10-CM | POA: Diagnosis not present

## 2022-03-03 DIAGNOSIS — I700 Atherosclerosis of aorta: Secondary | ICD-10-CM | POA: Diagnosis not present

## 2022-03-03 DIAGNOSIS — D692 Other nonthrombocytopenic purpura: Secondary | ICD-10-CM | POA: Diagnosis not present

## 2022-03-03 DIAGNOSIS — I4892 Unspecified atrial flutter: Secondary | ICD-10-CM | POA: Diagnosis not present

## 2022-03-03 DIAGNOSIS — E782 Mixed hyperlipidemia: Secondary | ICD-10-CM | POA: Diagnosis not present

## 2022-03-03 DIAGNOSIS — Z0000 Encounter for general adult medical examination without abnormal findings: Secondary | ICD-10-CM | POA: Diagnosis not present

## 2022-03-03 DIAGNOSIS — D6869 Other thrombophilia: Secondary | ICD-10-CM | POA: Diagnosis not present

## 2022-03-03 DIAGNOSIS — H3530 Unspecified macular degeneration: Secondary | ICD-10-CM | POA: Diagnosis not present

## 2022-03-03 DIAGNOSIS — N182 Chronic kidney disease, stage 2 (mild): Secondary | ICD-10-CM | POA: Diagnosis not present

## 2022-03-04 ENCOUNTER — Encounter: Payer: Self-pay | Admitting: Cardiology

## 2022-03-04 ENCOUNTER — Ambulatory Visit: Payer: Medicare Other | Admitting: Cardiology

## 2022-03-04 VITALS — BP 132/70 | HR 79 | Temp 97.7°F | Resp 16 | Ht 70.0 in | Wt 200.0 lb

## 2022-03-04 DIAGNOSIS — Z7901 Long term (current) use of anticoagulants: Secondary | ICD-10-CM | POA: Diagnosis not present

## 2022-03-04 DIAGNOSIS — E1165 Type 2 diabetes mellitus with hyperglycemia: Secondary | ICD-10-CM

## 2022-03-04 DIAGNOSIS — E039 Hypothyroidism, unspecified: Secondary | ICD-10-CM

## 2022-03-04 DIAGNOSIS — E782 Mixed hyperlipidemia: Secondary | ICD-10-CM

## 2022-03-04 DIAGNOSIS — Z87891 Personal history of nicotine dependence: Secondary | ICD-10-CM

## 2022-03-04 DIAGNOSIS — I10 Essential (primary) hypertension: Secondary | ICD-10-CM

## 2022-03-04 DIAGNOSIS — I4892 Unspecified atrial flutter: Secondary | ICD-10-CM

## 2022-03-04 NOTE — Progress Notes (Signed)
? ?Date:  03/04/2022  ? ?ID:  Isaac Clark, DOB 02-Oct-1941, MRN [id number] ? ?PCP:  Merrilee Seashore, MD  ?Cardiologist:  Rex Kras, DO, [facility] (established care 05/04/2021) ? ?Date: 03/04/22 ?Last Office Visit: May 28, 2021 ? ?Chief Complaint  ?Patient presents with  ? Follow-up  ?  6 month  ? Atrial Flutter  ? ? ?HPI  ?Isaac Clark is a 81 y.o. male whose past medical history and cardiovascular risk factors include: Paroxysmal atrial flutter, hypertension, hyperlipidemia, erectile dysfunction, non-insulin-dependent diabetes mellitus type 2, former smoker, advanced age. ? ?He is referred to the office at the request of Merrilee Seashore, MD for evaluation of atrial flutter. ? ?Patient presents today for 73-monthfollow-up visit for management of atrial flutter.  Based on EKG today he remains in normal sinus rhythm.  He is currently on rate control strategy.  On oral anticoagulation for thromboembolic prophylaxis.  Patient does not endorse any evidence of bleeding.  Last week patient states that he had an episode of atrial flutter which was noted on his iWatch, the episode lasted for 1 hour according to him, and spontaneously converted to normal sinus rhythm. ? ?Patient states that he recently had labs with his PCP labs were (last Thursday) I do not have those records available for review.  Not available in Care Everywhere.  We will request records.  Would like to recheck that his hemoglobin is stable as he is on oral anticoagulation. ? ? 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 ?ALLERGIES: ?Allergies  ?Allergen Reactions  ? Zithromax [Azithromycin] Diarrhea  ? ? ?MEDICATION LIST PRIOR TO VISIT: ?Current Meds  ?Medication Sig  ? albuterol (PROVENTIL HFA;VENTOLIN HFA) 108 (90 Base) MCG/ACT inhaler Inhale into the lungs every 6 (six) hours as needed for wheezing or shortness of breath.  ? Cholecalciferol (VITAMIN D PO) Take 250 mg by mouth  daily.  ? CINNAMON PO Take 250 mg by mouth daily.  ? Cyanocobalamin (VITAMIN B 12 PO) Take 1 tablet by mouth daily at 6 (six) AM.  ? levothyroxine (SYNTHROID, LEVOTHROID) 100 MCG tablet Take 100 mcg by mouth daily before breakfast.  ? lisinopril-hydrochlorothiazide (ZESTORETIC) 10-12.5 MG tablet Take 1 tablet by mouth daily.  ? metFORMIN (GLUCOPHAGE-XR) 750 MG 24 hr tablet Take 750 mg by mouth daily.  ? metoprolol succinate (TOPROL-XL) 25 MG 24 hr tablet TAKE ONE TABLET BY MOUTH EVERY MORNING  ? Multiple Vitamins-Minerals (ICAPS AREDS 2) CAPS Take by mouth daily.  ? sildenafil (VIAGRA) 50 MG tablet Take 60 mg by mouth daily as needed for erectile dysfunction.  ? simvastatin (ZOCOR) 20 MG tablet Take 20 mg by mouth daily.  ? XARELTO 20 MG TABS tablet Take 20 mg by mouth daily.  ? ?Current Facility-Administered Medications for the 03/04/22 encounter (Office Visit) with TRex Kras DO  ?Medication  ? 0.9 %  sodium chloride infusion  ?  ? ?PAST MEDICAL HISTORY: ?Past Medical History:  ?Diagnosis Date  ? Adenomatous colon polyp   ? Tubularvillous  ? Allergy   ? Arthritis   ? mild  ? Asthma   ? Atrial fibrillation ([place]   ? Atrial flutter ([place]   ? Cancer ([facility]   ? skin  ? Diabetes [place] complication ([place]   ? Hyperlipidemia   ? Hypertension   ? Thyroid disease   ? ? ?PAST SURGICAL HISTORY: ?Past Surgical History:  ?Procedure Laterality Date  ? CATARACT EXTRACTION Bilateral 2023  ?  COLONOSCOPY    ? INGUINAL HERNIA REPAIR Bilateral   ? MEDIAL PARTIAL KNEE REPLACEMENT Left   ? shoulder impingement surgery Right   ? TONSILLECTOMY    ? ? ?FAMILY HISTORY: ?The patient family history includes Heart disease in his father and mother; Hypertension in his father; Stroke in his mother. ? ?SOCIAL HISTORY:  ?The patient  reports that he quit smoking about 46 years ago. His smoking use included cigarettes. He has a 10.00 pack-year smoking history. He has never used smokeless tobacco. He reports current alcohol use of about  7.0 standard drinks per week. He reports that he does not use drugs. ? ?REVIEW OF SYSTEMS: ?Review of Systems  ?Cardiovascular:  Negative for chest pain, dyspnea on exertion, irregular heartbeat, leg swelling, orthopnea, palpitations, paroxysmal nocturnal dyspnea and syncope.  ?Respiratory:  Negative for shortness of breath.   ? ?PHYSICAL EXAM: ? ?  03/04/2022  ?  9:20 AM 03/04/2022  ?  8:50 AM 03/04/2022  ?  8:47 AM  ?Vitals with BMI  ?Height   5' 10"   ?Weight   200 lbs  ?BMI   28.7  ?Systolic 725 366 440  ?Diastolic 70 92 78  ?Pulse 79 76 63  ? ? ?CONSTITUTIONAL: Well-developed and well-nourished. No acute distress.  ?SKIN: Skin is warm and dry. No rash noted. No cyanosis. No pallor. No jaundice ?HEAD: Normocephalic and atraumatic.  ?EYES: No scleral icterus ?MOUTH/THROAT: Moist oral membranes.  ?NECK: No JVD present. No thyromegaly noted. No carotid bruits  ?LYMPHATIC: No visible cervical adenopathy.  ?CHEST Normal respiratory effort. No intercostal retractions  ?LUNGS: Clear to auscultation bilaterally.  No stridor. No wheezes. No rales.  ?CARDIOVASCULAR: Regular rate and rhythm, positive S1-S2, no murmurs rubs or gallops appreciated ?ABDOMINAL: Soft, nontender, nondistended, positive bowel sounds in all 4 quadrants, no apparent ascites.  ?EXTREMITIES: No peripheral edema  ?HEMATOLOGIC: No significant bruising ?NEUROLOGIC: Oriented to person, place, and time. Nonfocal. Normal muscle tone.  ?PSYCHIATRIC: Normal mood and affect. Normal behavior. Cooperative ? ?CARDIAC DATABASE: ?EKG: ?04/19/2021 provided by PCP: Atrial flutter, 86 bpm without underlying ischemia or injury pattern. ?03/04/2022: NSR, 65 bpm with sinus arrhythmia, without underlying ischemia injury pattern.  ? ?Echocardiogram: ?05/17/2021: ?Left ventricle cavity is normal in size. Mild concentric hypertrophy of the left ventricle. Normal global wall motion. Normal LV systolic function with EF 68%. Normal diastolic filling pattern. ?Moderate (Grade II)  mitral regurgitation. ?Mild tricuspid regurgitation. Estimated pulmonary artery systolic pressure 23 mmHg. ?  ?Stress Testing: ?Exercise tetrofosmin stress test  05/18/2021: ?Normal ECG stress. The patient exercised for 5 minutes and 44 seconds of a Bruce protocol, achieving approximately 7.05 METs.  achieved 112% of MPHR. Accelerate heart rate response. Hypertensive BP response. Peak BP 216/120 mm Hg. Dyspnea and recovery chest pain noted during exercise stress testing. Rare PVC. ?Myocardial perfusion is normal. ?Overall LV systolic function is normal without regional wall motion abnormalities. ?Stress LV EF: 69%. ?No previous exam available for comparison. Low risk. ? ?Heart Catheterization: ?None ? ?LABORATORY DATA: ?External Labs:  ?Date Collected: 04/19/2021 , information obtained by PCP.  ?Potassium: 4.4 ?Creatinine 1.20 mg/dL. ?eGFR: 58 mL/min per 1.73 m? ?Hemoglobin: 13.9 g/dL and hematocrit: 41.8 % ?AST: 18 , ALT: 24 , alkaline phosphatase: 79  ?TSH: 1.48  ? ?Date Collected: 02/08/2021 ?Lipid profile: Total cholesterol 163 , triglycerides 176 , HDL 62 , LDL 72 ?Hemoglobin A1c:  ? ?External Labs:  ?Name Result Date Reference Range  ?Colonoscopy   2021-12-15    ?Hemoglobin A1C  2021-08-10    ?Estimated Average Glucose 143      ?Hemoglobin A1C 6.6   <5.7  ?Lipid Panel   2021-08-10    ?Cholesterol 165   <200  ?Cholesterol / HDL Ratio 2.50   0.00-4.99  ?HDL Cholesterol 66   >39  ?LDL Cholesterol (Calculation) 69   <130  ?LDL/HDL Ratio 1.1   <3.3  ?Non-HDL Cholesterol 99   <130  ?Triglycerides 148   <150  ?Comprehensive Metabolic Panel RS In-house   2021-08-10    ?Albumin 4.2   3.4-5.0  ?Albumin/Globulin Ratio 1.4   1.1-2.5  ?Alkaline Phosphatase 83   25-150  ?ALT (SGPT) 24   <6-78  ?AST (SGOT) 21   0-40  ?Bilirubin, Total 0.6   0.2-1.0  ?BUN 16   7-18  ?BUN/Creatinine Ratio 14.8   11.0-26.0  ?Calcium 9.3   8.5-10.1  ?Chloride 99   98-107  ?CO2 30   21-32  ?Creatinine 1.08   0.70-1.30  ?GFR/Black 76   >59   ?GFR/White 65   >59  ?Globulin, Calculated 2.9   1.5-4.6  ?Glucose 146   74-106  ?Potassium 4.6   3.5-5.1  ?Protein 7.1   6.4-8.2  ?Sodium 139   136-145  ? ? ?IMPRESSION: ? ?  ICD-10-CM   ?1. Paroxysmal atrial fl

## 2022-03-08 ENCOUNTER — Other Ambulatory Visit: Payer: Self-pay

## 2022-03-08 DIAGNOSIS — Z7901 Long term (current) use of anticoagulants: Secondary | ICD-10-CM

## 2022-03-08 DIAGNOSIS — I4892 Unspecified atrial flutter: Secondary | ICD-10-CM

## 2022-03-10 ENCOUNTER — Emergency Department (HOSPITAL_COMMUNITY)
Admission: EM | Admit: 2022-03-10 | Discharge: 2022-03-10 | Disposition: A | Discharge status: Home / Self Care | Payer: Medicare Other | Attending: Emergency Medicine | Admitting: Emergency Medicine

## 2022-03-10 ENCOUNTER — Other Ambulatory Visit: Payer: Self-pay

## 2022-03-10 ENCOUNTER — Emergency Department (HOSPITAL_COMMUNITY): Payer: Medicare Other

## 2022-03-10 ENCOUNTER — Encounter (HOSPITAL_COMMUNITY): Payer: Self-pay

## 2022-03-10 DIAGNOSIS — M7981 Nontraumatic hematoma of soft tissue: Secondary | ICD-10-CM | POA: Diagnosis not present

## 2022-03-10 DIAGNOSIS — T148XXA Other injury of unspecified body region, initial encounter: Secondary | ICD-10-CM

## 2022-03-10 DIAGNOSIS — X58XXXA Exposure to other specified factors, initial encounter: Secondary | ICD-10-CM | POA: Diagnosis not present

## 2022-03-10 DIAGNOSIS — M7989 Other specified soft tissue disorders: Secondary | ICD-10-CM | POA: Diagnosis not present

## 2022-03-10 DIAGNOSIS — M79642 Pain in left hand: Secondary | ICD-10-CM | POA: Diagnosis not present

## 2022-03-10 DIAGNOSIS — Z79899 Other long term (current) drug therapy: Secondary | ICD-10-CM | POA: Diagnosis not present

## 2022-03-10 DIAGNOSIS — I4891 Unspecified atrial fibrillation: Secondary | ICD-10-CM | POA: Insufficient documentation

## 2022-03-10 DIAGNOSIS — S6992XA Unspecified injury of left wrist, hand and finger(s), initial encounter: Secondary | ICD-10-CM | POA: Diagnosis present

## 2022-03-10 DIAGNOSIS — S60222A Contusion of left hand, initial encounter: Secondary | ICD-10-CM | POA: Diagnosis not present

## 2022-03-10 NOTE — ED Provider Notes (Signed)
?[facility] ?Provider Note ? ? ?CSN: [id number] ?Arrival date & time: 03/10/22  1829 ? ?  ? ?History ? ?Chief Complaint  ?Patient presents with  ? Hand Injury  ? ? ?Isaac Clark is a 81 y.o. male.  He has a history of A-fib on Xarelto.  He accidentally struck his left hand on something about an hour ago and then started swelling on the back of his hand.  No numbness or weakness.  No other injuries or complaints.  He is trying ice with minimal improvement. ? ? ?Hand Injury ?Location:  Hand ?Hand location:  L hand ?Injury: yes   ?Time since incident:  1 hour ?Mechanism of injury comment:  Direct blow ?Pain details:  ?  Quality:  Throbbing ?  Radiates to:  Does not radiate ?  Severity:  Moderate ?  Onset quality:  Gradual ?  Timing:  Constant ?  Progression:  Unchanged ?Handedness:  Right-handed ?Dislocation: no   ?Relieved by:  Nothing ?Worsened by:  Nothing ?Ineffective treatments:  Ice ?Associated symptoms: swelling   ?Associated symptoms: no decreased range of motion, no fever, no muscle weakness and no numbness   ? ?  ? ?Home Medications ?Prior to Admission medications   ?Medication Sig Start Date End Date Taking? Authorizing Provider  ?albuterol (PROVENTIL HFA;VENTOLIN HFA) 108 (90 Base) MCG/ACT inhaler Inhale into the lungs every 6 (six) hours as needed for wheezing or shortness of breath.    [provider]  ?Cholecalciferol (VITAMIN D PO) Take 250 mg by mouth daily.    [provider]  ?CINNAMON PO Take 250 mg by mouth daily.    [provider]  ?Cyanocobalamin (VITAMIN B 12 PO) Take 1 tablet by mouth daily at 6 (six) AM.    [provider]  ?levothyroxine (SYNTHROID, LEVOTHROID) 100 MCG tablet Take 100 mcg by mouth daily before breakfast. 12/28/17   [provider]  ?lisinopril-hydrochlorothiazide (ZESTORETIC) 10-12.5 MG tablet Take 1 tablet by mouth daily. 04/19/21   [provider]  ?metFORMIN (GLUCOPHAGE-XR) 750 MG 24  hr tablet Take 750 mg by mouth daily. 11/30/17   [provider]  ?metoprolol succinate (TOPROL-XL) 25 MG 24 hr tablet TAKE ONE TABLET BY MOUTH EVERY MORNING 09/01/21   Adrian Prows, MD  ?Multiple Vitamins-Minerals (ICAPS AREDS 2) CAPS Take by mouth daily.    [provider]  ?sildenafil (VIAGRA) 50 MG tablet Take 60 mg by mouth daily as needed for erectile dysfunction.    [provider]  ?simvastatin (ZOCOR) 20 MG tablet Take 20 mg by mouth daily. 01/25/18   [provider]  ?XARELTO 20 MG TABS tablet Take 20 mg by mouth daily. 04/19/21   [provider]  ?   ? ?Allergies    ?Zithromax [azithromycin]   ? ?Review of Systems   ?Review of Systems  ?Constitutional:  Negative for fever.  ?Skin:  Negative for wound.  ?Neurological:  Negative for weakness and numbness.  ? ?Physical Exam ?Updated Vital Signs ?BP (!) 149/93 (BP Location: Right Arm)   Pulse 72   Temp 97.7 ?F (36.5 ?C) (Oral)   Resp 18   Ht '5\' 10"'$  (1.778 m)   Wt 86.2 kg   SpO2 92%   BMI 27.26 kg/m?  ?Physical Exam ?Vitals and nursing note reviewed.  ?Constitutional:   ?   Appearance: Normal appearance. He is well-developed.  ?HENT:  ?   Head: Normocephalic and atraumatic.  ?Eyes:  ?   Conjunctiva/sclera: Conjunctivae  normal.  ?Pulmonary:  ?   Effort: Pulmonary effort is normal.  ?Musculoskeletal:     ?   General: Swelling, tenderness and signs of injury present. Normal range of motion.  ?   Cervical back: Neck supple.  ?   Comments: He has marked swelling to the dorsum of his left hand extending over to his wrist.  He has full range of motion of his digits and normal sensation and cap refill.  Radial pulse 2+.  No open wounds.  ?Skin: ?   General: Skin is warm and dry.  ?Neurological:  ?   Mental Status: He is alert.  ?   GCS: GCS eye subscore is 4. GCS verbal subscore is 5. GCS motor subscore is 6.  ? ? ?ED Results / Procedures / Treatments   ?Labs ?(all labs ordered are listed, but only abnormal results are  displayed) ?Labs Reviewed - No data to display ? ?EKG ?None ? ?Radiology ?DG Hand Complete Left ? ?Result Date: 03/10/2022 ?CLINICAL DATA:  Hit left hand on door.  Left hand pain and swelling. EXAM: LEFT HAND - COMPLETE 3+ VIEW COMPARISON:  None. FINDINGS: There is no evidence of fracture or dislocation. Mild-to-moderate osteoarthritis is seen involving the DIP joint of the index finger and IP joint of the thumb. Soft tissue swelling is seen overlying the 5th metacarpal and distal. IMPRESSION: Soft tissue swelling. No evidence of fracture. 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 Electronically Signed   By: Marlaine Hind M.D.   On: 03/10/2022 19:06   ? ?Procedures ?Procedures  ? ? ?Medications Ordered in ED ?Medications - No data to display ? ?ED Course/ Medical Decision Making/ A&P ?Clinical Course as of 03/11/22 1004  ?Thu Mar 10, 2022  ?1908 X-ray left hand does not show any acute fractures.  Does have soft tissue swelling.  Awaiting radiology reading. [MB]  ?  ?Clinical Course User Index ?[MB] Hayden Rasmussen, MD  ? ?                        ?Medical Decision Making ?Amount and/or Complexity of Data Reviewed ?Radiology: ordered. ? ?This patient complains of left hand swelling and pain; this involves an extensive number of treatment ?Options and is a complaint that carries with it a high risk of complications and ?morbidity. The differential includes fracture, contusion, dislocation ?I ordered imaging studies which included x-ray left hand and I independently ?   visualized and interpreted imaging which showed no acute fracture ?Additional history obtained from patient's wife ? ?After the interventions stated above, I reevaluated the patient and found patient to be otherwise asymptomatic ?Admission and further testing considered, no indications for further testing or admission at this time.  Patient placed in Ace wrap by orthopedic technician.  Symptomatic treatment discussed.   Return instructions discussed. ? ? ? ? ? ? ? ? ? ?Final Clinical Impression(s) / ED Diagnoses ?Final diagnoses:  ?Contusion of left hand, initial encounter  ?Hematoma  ? ? ?Rx / DC Orders ?ED Discharge Orders   ? ? None  ? ?  ? ? ?  ?Hayden Rasmussen, MD ?03/11/22 1005 ? ?

## 2022-03-10 NOTE — ED Notes (Signed)
An After Visit Summary was printed and given to the patient. Discharge instructions given and no further questions at this time.  Pt leaving with wife.  

## 2022-03-10 NOTE — ED Triage Notes (Signed)
Patient states that he bumped his left hand on a door and is currently taking Xarelto. Patient has a large hematoma to the left hand ?

## 2022-03-10 NOTE — ED Notes (Signed)
ED Provider at bedside. 

## 2022-03-10 NOTE — Discharge Instructions (Signed)
You are seen in the emergency department for swelling of your left hand after you struck it.  Your x-ray did not show any acute fracture.  This is likely bleeding underneath the skin worsened by your blood thinner.  Please elevate your arm, continue with the Ace wrap and ice.  This will usually resolve over a few days.  Return if any worsening or concerning symptoms. ?

## 2022-03-22 DIAGNOSIS — T148XXA Other injury of unspecified body region, initial encounter: Secondary | ICD-10-CM | POA: Diagnosis not present

## 2022-03-31 ENCOUNTER — Encounter: Payer: Medicare Other | Admitting: Cardiology

## 2022-06-01 ENCOUNTER — Other Ambulatory Visit: Payer: Self-pay

## 2022-06-01 DIAGNOSIS — I4892 Unspecified atrial flutter: Secondary | ICD-10-CM

## 2022-06-01 MED ORDER — METOPROLOL SUCCINATE ER 25 MG PO TB24: 25.0000 mg | ORAL_TABLET | Freq: Every morning | ORAL | 2 refills | Status: DC

## 2022-08-25 DIAGNOSIS — E782 Mixed hyperlipidemia: Secondary | ICD-10-CM | POA: Diagnosis not present

## 2022-08-25 DIAGNOSIS — I10 Essential (primary) hypertension: Secondary | ICD-10-CM | POA: Diagnosis not present

## 2022-08-25 DIAGNOSIS — E1122 Type 2 diabetes mellitus with diabetic chronic kidney disease: Secondary | ICD-10-CM | POA: Diagnosis not present

## 2022-08-25 DIAGNOSIS — E039 Hypothyroidism, unspecified: Secondary | ICD-10-CM | POA: Diagnosis not present

## 2022-08-25 DIAGNOSIS — I700 Atherosclerosis of aorta: Secondary | ICD-10-CM | POA: Diagnosis not present

## 2022-08-25 DIAGNOSIS — D692 Other nonthrombocytopenic purpura: Secondary | ICD-10-CM | POA: Diagnosis not present

## 2022-08-25 DIAGNOSIS — I4892 Unspecified atrial flutter: Secondary | ICD-10-CM | POA: Diagnosis not present

## 2022-08-25 DIAGNOSIS — D6869 Other thrombophilia: Secondary | ICD-10-CM | POA: Diagnosis not present

## 2022-08-25 DIAGNOSIS — N182 Chronic kidney disease, stage 2 (mild): Secondary | ICD-10-CM | POA: Diagnosis not present

## 2022-09-01 DIAGNOSIS — H3530 Unspecified macular degeneration: Secondary | ICD-10-CM | POA: Diagnosis not present

## 2022-09-01 DIAGNOSIS — I10 Essential (primary) hypertension: Secondary | ICD-10-CM | POA: Diagnosis not present

## 2022-09-01 DIAGNOSIS — N182 Chronic kidney disease, stage 2 (mild): Secondary | ICD-10-CM | POA: Diagnosis not present

## 2022-09-01 DIAGNOSIS — D6869 Other thrombophilia: Secondary | ICD-10-CM | POA: Diagnosis not present

## 2022-09-01 DIAGNOSIS — D692 Other nonthrombocytopenic purpura: Secondary | ICD-10-CM | POA: Diagnosis not present

## 2022-09-01 DIAGNOSIS — Z23 Encounter for immunization: Secondary | ICD-10-CM | POA: Diagnosis not present

## 2022-09-01 DIAGNOSIS — I4892 Unspecified atrial flutter: Secondary | ICD-10-CM | POA: Diagnosis not present

## 2022-09-01 DIAGNOSIS — E039 Hypothyroidism, unspecified: Secondary | ICD-10-CM | POA: Diagnosis not present

## 2022-09-01 DIAGNOSIS — E782 Mixed hyperlipidemia: Secondary | ICD-10-CM | POA: Diagnosis not present

## 2022-09-01 DIAGNOSIS — I700 Atherosclerosis of aorta: Secondary | ICD-10-CM | POA: Diagnosis not present

## 2022-09-01 DIAGNOSIS — E1122 Type 2 diabetes mellitus with diabetic chronic kidney disease: Secondary | ICD-10-CM | POA: Diagnosis not present

## 2022-09-05 ENCOUNTER — Encounter: Payer: Self-pay | Admitting: Cardiology

## 2022-09-05 ENCOUNTER — Ambulatory Visit: Payer: Medicare Other | Admitting: Cardiology

## 2022-09-05 VITALS — BP 140/81 | HR 79 | Temp 97.5°F | Resp 16 | Ht 70.0 in | Wt 201.0 lb

## 2022-09-05 DIAGNOSIS — E782 Mixed hyperlipidemia: Secondary | ICD-10-CM | POA: Diagnosis not present

## 2022-09-05 DIAGNOSIS — Z7901 Long term (current) use of anticoagulants: Secondary | ICD-10-CM | POA: Diagnosis not present

## 2022-09-05 DIAGNOSIS — I10 Essential (primary) hypertension: Secondary | ICD-10-CM | POA: Diagnosis not present

## 2022-09-05 DIAGNOSIS — I4892 Unspecified atrial flutter: Secondary | ICD-10-CM | POA: Diagnosis not present

## 2022-09-05 DIAGNOSIS — E039 Hypothyroidism, unspecified: Secondary | ICD-10-CM

## 2022-09-05 DIAGNOSIS — E1165 Type 2 diabetes mellitus with hyperglycemia: Secondary | ICD-10-CM

## 2022-09-05 DIAGNOSIS — Z87891 Personal history of nicotine dependence: Secondary | ICD-10-CM | POA: Diagnosis not present

## 2022-09-05 MED ORDER — FLECAINIDE ACETATE 50 MG PO TABS: 50.0000 mg | ORAL_TABLET | Freq: Two times a day (BID) | ORAL | 0 refills | Status: DC

## 2022-09-05 NOTE — Progress Notes (Signed)
Date:  09/05/2022   ID:  Isaac Clark, DOB 03-28-1941, MRN [id number]  PCP:  Merrilee Seashore, MD  Cardiologist:  Rex Kras, DO, [facility] (established care 05/04/2021)  Date: 09/05/22 Last Office Visit: 03/04/2022  Chief Complaint  Patient presents with   Atrial Flutter   Follow-up    HPI  Isaac Clark is a 81 y.o. male whose past medical history and cardiovascular risk factors include: Paroxysmal atrial flutter, hypertension, hyperlipidemia, erectile dysfunction, non-insulin-dependent diabetes mellitus type 2, former smoker, advanced age.  He has a history of paroxysmal atrial flutter and presents today for 7-monthfollow-up visit. He remains in sinus rhythm today but according to his iWatch he has flutter/fibrillation burden of approximately 17% on a weekly basis.  He does not wear his watch while asleep.  He is currently on AV nodal blocking agents for rate control strategy and anticoagulation for thromboembolic prophylaxis.  Patient does not endorse any evidence of bleeding and no recent falls.  Outside labs from care everywhere reviewed as part of today's office visit.   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   Cholecalciferol (VITAMIN D PO) Take 250 mg by mouth daily.   CINNAMON PO Take 250 mg by mouth daily.   Cyanocobalamin (VITAMIN B 12 PO) Take 1 tablet by mouth daily at 6 (six) AM.   flecainide (TAMBOCOR) 50 MG tablet Take 1 tablet (50 mg total) by mouth 2 (two) times daily.   levothyroxine (SYNTHROID, LEVOTHROID) 100 MCG tablet Take 100 mcg by mouth daily before breakfast.    lisinopril-hydrochlorothiazide (ZESTORETIC) 10-12.5 MG tablet Take 1 tablet by mouth daily.   metFORMIN (GLUCOPHAGE-XR) 750 MG 24 hr tablet Take 750 mg by mouth daily.   metoprolol succinate (TOPROL-XL) 25 MG 24 hr tablet Take 1 tablet (25 mg total) by mouth every morning.   Multiple Vitamins-Minerals (ICAPS AREDS 2) CAPS Take by mouth daily.   sildenafil (VIAGRA) 50 MG tablet Take 60 mg by mouth daily as needed for erectile dysfunction.   simvastatin (ZOCOR) 20 MG tablet Take 20 mg by mouth daily.   XARELTO 20 MG TABS tablet Take 20 mg by mouth daily.   Current Facility-Administered Medications for the 09/05/22 encounter (Office Visit) with TTerri Skains Danzel Marszalek, DO  Medication   0.9 %  sodium chloride infusion     PAST MEDICAL HISTORY: Past Medical History:  Diagnosis Date   Adenomatous colon polyp    Tubularvillous   Allergy    Arthritis    mild   Asthma    Atrial fibrillation ([place]    [place]rial flutter ([place]    Cancer (HCC)    skin   Diabetes mellitus without complication ([place]    Hyperlipidemia    Hypertension    Thyroid disease     PAST SURGICAL HISTORY: Past Surgical History:  Procedure Laterality Date   CATARACT EXTRACTION Bilateral 2023   COLONOSCOPY     INGUINAL HERNIA REPAIR Bilateral    MEDIAL PARTIAL KNEE REPLACEMENT Left    shoulder impingement surgery Right    TONSILLECTOMY      FAMILY HISTORY: The patient family history includes Heart disease in his father and mother;  Hypertension in his father; Stroke in his mother.  SOCIAL HISTORY:  The patient  reports that he quit smoking about 46 years ago. His smoking use included cigarettes. He has a 10.00 pack-year smoking history. He has never used smokeless tobacco. He reports current alcohol use of about 7.0 standard drinks of alcohol per week. He reports that he does not use drugs.  REVIEW OF SYSTEMS: Review of Systems  Cardiovascular:  Negative for chest pain, dyspnea on exertion, irregular heartbeat, leg swelling,  orthopnea, palpitations, paroxysmal nocturnal dyspnea and syncope.  Respiratory:  Negative for shortness of breath.     PHYSICAL EXAM:    09/05/2022   10:01 AM 03/10/2022    6:39 PM 03/10/2022    6:34 PM  Vitals with BMI  Height 5' 10" 5' 10"   Weight 201 lbs 190 lbs   BMI 28.84 27.26   Systolic 140  149  Diastolic 81  93  Pulse 79  72   Physical Exam  Constitutional: No distress.  Age appropriate, hemodynamically stable.   Neck: No JVD present.  Cardiovascular: Normal rate, regular rhythm, S1 normal, S2 normal, intact distal pulses and normal pulses. Exam reveals no gallop, no S3 and no S4.  No murmur heard. Pulmonary/Chest: Effort normal and breath sounds normal. No stridor. He has no wheezes. He has no rales.  Abdominal: Soft. Bowel sounds are normal. He exhibits no distension. There is no abdominal tenderness.  Musculoskeletal:        General: No edema.     Cervical back: Neck supple.  Neurological: He is alert and oriented to person, place, and time. He has intact cranial nerves (2-12).  Skin: Skin is warm and moist.   CARDIAC DATABASE: EKG: 04/19/2021 provided by PCP: Atrial flutter, 86 bpm without underlying ischemia or injury pattern. 09/05/2022: Normal sinus rhythm, 80 bpm, normal axis, IRBBB, without underlying ischemia injury pattern  Echocardiogram: 05/17/2021: Left ventricle cavity is normal in size. Mild concentric hypertrophy of the left ventricle. Normal global wall motion. Normal LV systolic function with EF 68%. Normal diastolic filling pattern. Moderate (Grade II) mitral regurgitation. Mild tricuspid regurgitation. Estimated pulmonary artery systolic pressure 23 mmHg.   Stress Testing: Exercise tetrofosmin stress test  05/18/2021: Normal ECG stress. The patient exercised for 5 minutes and 44 seconds of a Bruce protocol, achieving approximately 7.05 METs.  achieved 112% of MPHR. Accelerate heart rate response. Hypertensive BP response. Peak BP 216/120 mm Hg.  Dyspnea and recovery chest pain noted during exercise stress testing. Rare PVC. Myocardial perfusion is normal. Overall LV systolic function is normal without regional wall motion abnormalities. Stress LV EF: 69%. No previous exam available for comparison. Low risk.  Heart Catheterization: None  LABORATORY DATA: External Labs:  Date Collected: 04/19/2021 , information obtained by PCP.  Potassium: 4.4 Creatinine 1.20 mg/dL. eGFR: 58 mL/min per 1.73 m Hemoglobin: 13.9 g/dL and hematocrit: 41.8 % AST: 18 , ALT: 24 , alkaline phosphatase: 79  TSH: 1.48   Date Collected: 02/08/2021 Lipid profile: Total cholesterol 163 , triglycerides 176 , HDL 62 , LDL 72 Hemoglobin A1c:   External Labs:  Name Result Date Reference Range  Colonoscopy   2021-12-15    Hemoglobin A1C   2021-08-10    Estimated Average Glucose 143      Hemoglobin A1C 6.6   <5.7  Lipid Panel   2021-08-10    Cholesterol 165   <200  Cholesterol / HDL Ratio 2.50   0.00-4.99  HDL Cholesterol 66   >39    LDL Cholesterol (Calculation) 69   <130  LDL/HDL Ratio 1.1   <3.3  Non-HDL Cholesterol 99   <130  Triglycerides 148   <150  Comprehensive Metabolic Panel RS In-house   2021-08-10    Albumin 4.2   3.4-5.0  Albumin/Globulin Ratio 1.4   1.1-2.5  Alkaline Phosphatase 83   25-150  ALT (SGPT) 24   <6-78  AST (SGOT) 21   0-40  Bilirubin, Total 0.6   0.2-1.0  BUN 16   7-18  BUN/Creatinine Ratio 14.8   11.0-26.0  Calcium 9.3   8.5-10.1  Chloride 99   98-107  CO2 30   21-32  Creatinine 1.08   0.70-1.30  GFR/Black 76   >59  GFR/White 65   >59  Globulin, Calculated 2.9   1.5-4.6  Glucose 146   74-106  Potassium 4.6   3.5-5.1  Protein 7.1   6.4-8.2  Sodium 139   136-145   External Labs: Collected: August 29, 2022 performed at PCPs office available in Care Everywhere. A1c 6.7 Total cholesterol 158, triglycerides 96, HDL 70, non-HDL 88, LDL 69 Sodium 136, potassium 4, chloride 100, bicarb 28, BUN 22, creatinine  1.16. AST 23, ALT 20, alkaline phosphatase 76  IMPRESSION:    ICD-10-CM   1. Paroxysmal atrial flutter (HCC)  I48.92 EKG 12-Lead    flecainide (TAMBOCOR) 50 MG tablet    Ambulatory referral to Sleep Studies    2. Long term (current) use of anticoagulants  Z79.01     3. Benign hypertension  I10     4. Mixed hyperlipidemia  E78.2     5. Hypothyroidism, unspecified type  E03.9     6. Type 2 diabetes mellitus with hyperglycemia, without long-term current use of insulin (HCC)  E11.65     7. Former smoker  Z87.891        RECOMMENDATIONS: Isaac Clark is a 81 y.o. male whose past medical history and cardiac risk factors include: Paroxysmal atrial flutter, hypertension, hyperlipidemia, erectile dysfunction, non-insulin-dependent diabetes mellitus type 2, former smoker, advanced age.  Paroxysmal atrial flutter (HCC) Rate control: Metoprolol. Rhythm control: Flecainide Thromboembolic prophylaxis: Xarelto. CHA2DS2-VASc SCORE is 4 which correlates to 4.8% risk of stroke per year (DM, HTN, age greater than 75). His iWatch has alerted him that on a weekly basis that his atrial flutter/fibrillation burden is approximately 17%.  To prevent episodes of rapid ventricular rate would like to initiate antiarrhythmic medications.  Shared decision was to start flecainide.  His ventricular rate according to his iWatch is around 70 bpm.  He does not wear his iWatch at night.  I have asked him to wear it for 1 week to see if he has increased burden of A-fib.  I suspect that he does have sleep apnea as well I will refer him to sleep medicine.  Long term (current) use of anticoagulants Indication: Paroxysmal atrial flutter. Reemphasized the risks, benefits, alternatives to oral anticoagulation. Outside labs from care everywhere reviewed and noted above for further reference.  Benign hypertension Office blood pressures within acceptable range, home blood pressures are better controlled. No changes  warranted at this time. Currently managed by primary care provider.  Mixed hyperlipidemia Currently on simvastatin.   He denies myalgia or other side effects. Most recent lipids dated September 2023, independently reviewed as noted above. Currently managed by primary care provider.  I have asked him to monitor his a flutter/fibrillation burden via his iWatch after starting flecainide.  If the overall burden reduces to less than 10%   we will continue flecainide 50 mg p.o. twice daily.  However, if the overall burden is still greater than 10% after initiating flecainide 50 twice daily I have asked him to follow-up in 4 weeks to evaluate up titration of flecainide.    FINAL MEDICATION LIST END OF ENCOUNTER: Meds ordered this encounter  Medications   flecainide (TAMBOCOR) 50 MG tablet    Sig: Take 1 tablet (50 mg total) by mouth 2 (two) times daily.    Dispense:  60 tablet    Refill:  0    There are no discontinued medications.    Current Outpatient Medications:    albuterol (PROVENTIL HFA;VENTOLIN HFA) 108 (90 Base) MCG/ACT inhaler, Inhale into the lungs every 6 (six) hours as needed for wheezing or shortness of breath., Disp: , Rfl:    Cholecalciferol (VITAMIN D PO), Take 250 mg by mouth daily., Disp: , Rfl:    CINNAMON PO, Take 250 mg by mouth daily., Disp: , Rfl:    Cyanocobalamin (VITAMIN B 12 PO), Take 1 tablet by mouth daily at 6 (six) AM., Disp: , Rfl:    flecainide (TAMBOCOR) 50 MG tablet, Take 1 tablet (50 mg total) by mouth 2 (two) times daily., Disp: 60 tablet, Rfl: 0   levothyroxine (SYNTHROID, LEVOTHROID) 100 MCG tablet, Take 100 mcg by mouth daily before breakfast., Disp: , Rfl: 1   lisinopril-hydrochlorothiazide (ZESTORETIC) 10-12.5 MG tablet, Take 1 tablet by mouth daily., Disp: , Rfl:    metFORMIN (GLUCOPHAGE-XR) 750 MG 24 hr tablet, Take 750 mg by mouth daily., Disp: , Rfl: 0   metoprolol succinate (TOPROL-XL) 25 MG 24 hr tablet, Take 1 tablet (25 mg total) by mouth every  morning., Disp: 90 tablet, Rfl: 2   Multiple Vitamins-Minerals (ICAPS AREDS 2) CAPS, Take by mouth daily., Disp: , Rfl:    sildenafil (VIAGRA) 50 MG tablet, Take 60 mg by mouth daily as needed for erectile dysfunction., Disp: , Rfl:    simvastatin (ZOCOR) 20 MG tablet, Take 20 mg by mouth daily., Disp: , Rfl: 3   XARELTO 20 MG TABS tablet, Take 20 mg by mouth daily., Disp: , Rfl:   Current Facility-Administered Medications:    0.9 %  sodium chloride infusion, 500 mL, Intravenous, Once, Danis, Kirke Corin, MD  Orders Placed This Encounter  Procedures   Ambulatory referral to Sleep Studies   EKG 12-Lead    There are no Patient Instructions on file for this visit.   --Continue cardiac medications as reconciled in final medication list. --Return in about 4 weeks (around 10/03/2022) for Follow up, Atrial flutter, AAD. Or sooner if needed. --Continue follow-up with your primary care physician regarding the management of your other chronic comorbid conditions.  Patient's questions and concerns were addressed to his satisfaction. He voices understanding of the instructions provided during this encounter.   This note was created using a voice recognition software as a result there may be grammatical errors inadvertently enclosed that do not reflect the nature of this encounter. Every attempt is made to correct such errors.  Rex Kras, [place], [facility]  Pager: [phone] Office: [phone]

## 2022-09-06 DIAGNOSIS — H31093 Other chorioretinal scars, bilateral: Secondary | ICD-10-CM | POA: Diagnosis not present

## 2022-09-06 DIAGNOSIS — H02834 Dermatochalasis of left upper eyelid: Secondary | ICD-10-CM | POA: Diagnosis not present

## 2022-09-06 DIAGNOSIS — H02831 Dermatochalasis of right upper eyelid: Secondary | ICD-10-CM | POA: Diagnosis not present

## 2022-09-06 DIAGNOSIS — E119 Type 2 diabetes mellitus without complications: Secondary | ICD-10-CM | POA: Diagnosis not present

## 2022-09-06 DIAGNOSIS — H353132 Nonexudative age-related macular degeneration, bilateral, intermediate dry stage: Secondary | ICD-10-CM | POA: Diagnosis not present

## 2022-09-06 DIAGNOSIS — H43812 Vitreous degeneration, left eye: Secondary | ICD-10-CM | POA: Diagnosis not present

## 2022-09-06 DIAGNOSIS — Z961 Presence of intraocular lens: Secondary | ICD-10-CM | POA: Diagnosis not present

## 2022-09-26 ENCOUNTER — Encounter (INDEPENDENT_AMBULATORY_CARE_PROVIDER_SITE_OTHER): Payer: Medicare Other | Admitting: Ophthalmology

## 2022-09-26 DIAGNOSIS — H43813 Vitreous degeneration, bilateral: Secondary | ICD-10-CM

## 2022-09-26 DIAGNOSIS — I10 Essential (primary) hypertension: Secondary | ICD-10-CM

## 2022-09-26 DIAGNOSIS — H353122 Nonexudative age-related macular degeneration, left eye, intermediate dry stage: Secondary | ICD-10-CM | POA: Diagnosis not present

## 2022-09-26 DIAGNOSIS — H353211 Exudative age-related macular degeneration, right eye, with active choroidal neovascularization: Secondary | ICD-10-CM | POA: Diagnosis not present

## 2022-09-26 DIAGNOSIS — H35033 Hypertensive retinopathy, bilateral: Secondary | ICD-10-CM | POA: Diagnosis not present

## 2022-10-03 ENCOUNTER — Ambulatory Visit: Payer: Medicare Other | Admitting: Cardiology

## 2022-10-04 ENCOUNTER — Other Ambulatory Visit: Payer: Self-pay | Admitting: Cardiology

## 2022-10-04 DIAGNOSIS — I4892 Unspecified atrial flutter: Secondary | ICD-10-CM

## 2022-10-04 DIAGNOSIS — Z79899 Other long term (current) drug therapy: Secondary | ICD-10-CM | POA: Diagnosis not present

## 2022-10-04 NOTE — Telephone Encounter (Signed)
Can I refill?

## 2022-10-06 NOTE — Telephone Encounter (Signed)
Spoke to the patient over the phone.  Initially at the last visit he noted that he was having atrial flutter approximately 17% of the time based on his iWatch.  I asked him to start wearing his watch at night to see if the overall burden increases.  He did follow through and states that he wore it all day long his A-fib burden is approximately 40%.  After starting flecainide 50 mg p.o. twice daily the overall  burden reduced to 25% according to him.  Patient is tolerating flecainide well without any intolerances or side effects.  We will increase the flecainide to 100 mg p.o. twice daily for another 30 days to see if that improves his overall symptoms and/or burden.  He still has make an appointment with sleep medicine to be evaluated for sleep apnea which is very possible.  Importance of it reemphasized.    ICD-10-CM   1. Paroxysmal atrial flutter (HCC)  I48.92 flecainide (TAMBOCOR) 100 MG tablet    2. Long term current use of antiarrhythmic drug  Z79.899      No orders of the defined types were placed in this encounter.  Meds ordered this encounter  Medications   flecainide (TAMBOCOR) 100 MG tablet    Sig: Take 1 tablet (100 mg total) by mouth 2 (two) times daily.    Dispense:  60 tablet    Refill:  0     Total time spent: 8 minutes  Taneah Masri Benson, DO, [facility]

## 2022-10-24 ENCOUNTER — Encounter (INDEPENDENT_AMBULATORY_CARE_PROVIDER_SITE_OTHER): Payer: Medicare Other | Admitting: Ophthalmology

## 2022-10-24 DIAGNOSIS — H353211 Exudative age-related macular degeneration, right eye, with active choroidal neovascularization: Secondary | ICD-10-CM

## 2022-10-24 DIAGNOSIS — H35033 Hypertensive retinopathy, bilateral: Secondary | ICD-10-CM | POA: Diagnosis not present

## 2022-10-24 DIAGNOSIS — I10 Essential (primary) hypertension: Secondary | ICD-10-CM

## 2022-10-24 DIAGNOSIS — H353122 Nonexudative age-related macular degeneration, left eye, intermediate dry stage: Secondary | ICD-10-CM

## 2022-10-24 DIAGNOSIS — H43813 Vitreous degeneration, bilateral: Secondary | ICD-10-CM

## 2022-11-03 ENCOUNTER — Other Ambulatory Visit: Payer: Self-pay | Admitting: Cardiology

## 2022-11-03 DIAGNOSIS — I4892 Unspecified atrial flutter: Secondary | ICD-10-CM

## 2022-11-03 HISTORY — PX: OTHER SURGICAL HISTORY: SHX169

## 2022-11-03 NOTE — Telephone Encounter (Signed)
Refill request

## 2022-11-08 ENCOUNTER — Other Ambulatory Visit: Payer: Self-pay

## 2022-11-08 DIAGNOSIS — I4892 Unspecified atrial flutter: Secondary | ICD-10-CM

## 2022-11-08 NOTE — Telephone Encounter (Signed)
Lvm for pt to call back. 

## 2022-11-09 NOTE — Telephone Encounter (Signed)
Please make sure he gets labs before the appt on the 11/14/2022.  Dr.Jaceon Heiberger

## 2022-11-10 DIAGNOSIS — I4892 Unspecified atrial flutter: Secondary | ICD-10-CM | POA: Diagnosis not present

## 2022-11-11 DIAGNOSIS — Z7901 Long term (current) use of anticoagulants: Secondary | ICD-10-CM | POA: Diagnosis not present

## 2022-11-11 DIAGNOSIS — I4892 Unspecified atrial flutter: Secondary | ICD-10-CM | POA: Diagnosis not present

## 2022-11-11 DIAGNOSIS — Z79899 Other long term (current) drug therapy: Secondary | ICD-10-CM | POA: Diagnosis not present

## 2022-11-11 LAB — BASIC METABOLIC PANEL
BUN/Creatinine Ratio: 17 (ref 10–24)
BUN: 19 mg/dL (ref 8–27)
CO2: 27 mmol/L (ref 20–29)
Calcium: 9.3 mg/dL (ref 8.6–10.2)
Chloride: 91 mmol/L — ABNORMAL LOW (ref 96–106)
Creatinine, Ser: 1.15 mg/dL (ref 0.76–1.27)
Glucose: 166 mg/dL — ABNORMAL HIGH (ref 70–99)
Potassium: 4.2 mmol/L (ref 3.5–5.2)
Sodium: 134 mmol/L (ref 134–144)
eGFR: 65 mL/min/{1.73_m2} (ref >59)

## 2022-11-11 LAB — MAGNESIUM: Magnesium: 2 mg/dL (ref 1.6–2.3)

## 2022-11-14 ENCOUNTER — Ambulatory Visit: Payer: Medicare Other | Admitting: Cardiology

## 2022-11-14 ENCOUNTER — Encounter: Payer: Self-pay | Admitting: Cardiology

## 2022-11-14 VITALS — BP 147/77 | HR 60 | Ht 70.0 in | Wt 198.2 lb

## 2022-11-14 DIAGNOSIS — I4892 Unspecified atrial flutter: Secondary | ICD-10-CM

## 2022-11-14 DIAGNOSIS — E1165 Type 2 diabetes mellitus with hyperglycemia: Secondary | ICD-10-CM

## 2022-11-14 DIAGNOSIS — I10 Essential (primary) hypertension: Secondary | ICD-10-CM

## 2022-11-14 DIAGNOSIS — E039 Hypothyroidism, unspecified: Secondary | ICD-10-CM

## 2022-11-14 DIAGNOSIS — Z87891 Personal history of nicotine dependence: Secondary | ICD-10-CM

## 2022-11-14 DIAGNOSIS — Z79899 Other long term (current) drug therapy: Secondary | ICD-10-CM

## 2022-11-14 DIAGNOSIS — E782 Mixed hyperlipidemia: Secondary | ICD-10-CM

## 2022-11-14 DIAGNOSIS — Z7901 Long term (current) use of anticoagulants: Secondary | ICD-10-CM

## 2022-11-14 MED ORDER — FLECAINIDE ACETATE 50 MG PO TABS: 50.0000 mg | ORAL_TABLET | Freq: Two times a day (BID) | ORAL | 0 refills | Status: DC

## 2022-11-14 NOTE — Progress Notes (Signed)
Date:  11/14/2022   ID:  DAISHAWN Clark, DOB 08/23/1941, MRN [id number]  PCP:  Merrilee Seashore, MD  Cardiologist:  Rex Kras, DO, [facility] (established care 05/04/2021)  Date: 11/14/22 Last Office Visit: 09/05/2022  Chief Complaint  Patient presents with   Follow-up   Atrial Flutter    HPI  Isaac Clark is a 81 y.o. male whose past medical history and cardiovascular risk factors include: Paroxysmal atrial flutter, hypertension, hyperlipidemia, erectile dysfunction, non-insulin-dependent diabetes mellitus type 2, former smoker, advanced age.  Patient has known history of paroxysmal atrial flutter.  During his last office visit patient had noticed that his iWatch noted A-fib/flutter burden of approximately 40%.  After initiating flecainide his overall burden went down to 30% which further improved with up titration of flecainide to 100 mg p.o. twice daily to 20%.  However, he is noted that he is feeling more tired and fatigue and his overall functional capacity has reduced.  He denies any near-syncope or syncopal events.  He continues to be on oral anticoagulation for thromboembolic prophylaxis.  Does not endorse evidence of bleeding or recent falls.   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  ALLERGIES: Allergies  Allergen Reactions   Zithromax [Azithromycin] Diarrhea    MEDICATION LIST PRIOR TO VISIT: Current Meds  Medication Sig   albuterol (PROVENTIL HFA;VENTOLIN HFA) 108 (90 Base) MCG/ACT inhaler Inhale into the lungs every 6 (six) hours as needed for wheezing or shortness of breath.   Cholecalciferol (VITAMIN D PO) Take 250 mg by mouth daily.   CINNAMON PO Take 250 mg by mouth daily.   Cyanocobalamin (VITAMIN B 12 PO) Take 1 tablet by mouth daily at 6 (six) AM.   flecainide (TAMBOCOR) 50 MG tablet Take 1 tablet (50 mg total) by mouth 2 (two) times daily.   levothyroxine (SYNTHROID, LEVOTHROID) 100  MCG tablet Take 100 mcg by mouth daily before breakfast.   lisinopril-hydrochlorothiazide (ZESTORETIC) 10-12.5 MG tablet Take 1 tablet by mouth daily.   metFORMIN (GLUCOPHAGE-XR) 750 MG 24 hr tablet Take 750 mg by mouth daily.   metoprolol succinate (TOPROL-XL) 25 MG 24 hr tablet Take 1 tablet (25 mg total) by mouth every morning.   Multiple Vitamins-Minerals (ICAPS AREDS 2) CAPS Take by mouth daily.   sildenafil (VIAGRA) 50 MG tablet Take 60 mg by mouth daily as needed for erectile dysfunction.   simvastatin (ZOCOR) 20 MG tablet Take 20 mg by mouth daily.   XARELTO 20 MG TABS tablet Take 20 mg by mouth daily.   [DISCONTINUED] flecainide (TAMBOCOR) 100 MG tablet TAKE 1 TABLET BY MOUTH TWICE A DAY   Current Facility-Administered Medications for the 11/14/22 encounter (Office Visit) with Rex Kras, DO  Medication   0.9 %  sodium chloride infusion     PAST MEDICAL HISTORY: Past Medical History:  Diagnosis Date   Adenomatous colon polyp    Tubularvillous   Allergy    Arthritis    mild   Asthma    Atrial fibrillation ([place])    Atrial flutter ([place])    Cancer ([place])    skin   Diabetes mellitus without complication ([place])    Hyperlipidemia    Hypertension    Thyroid disease     PAST SURGICAL HISTORY: Past Surgical History:  Procedure Laterality Date   CATARACT EXTRACTION Bilateral 2023   COLONOSCOPY     eye injections  11/03/2022   INGUINAL HERNIA REPAIR Bilateral    MEDIAL  PARTIAL KNEE REPLACEMENT Left    shoulder impingement surgery Right    TONSILLECTOMY      FAMILY HISTORY: The patient family history includes Heart disease in his father and mother; Hypertension in his father; Stroke in his mother.  SOCIAL HISTORY:  The patient  reports that he quit smoking about 46 years ago. His smoking use included cigarettes. He has a 10.00 pack-year smoking history. He has never used smokeless tobacco. He reports current alcohol use of about 7.0 standard drinks of alcohol per week.  He reports that he does not use drugs.  REVIEW OF SYSTEMS: Review of Systems  Constitutional: Positive for malaise/fatigue.  Cardiovascular:  Negative for chest pain, dyspnea on exertion, irregular heartbeat, leg swelling, orthopnea, palpitations, paroxysmal nocturnal dyspnea and syncope.  Respiratory:  Negative for shortness of breath.     PHYSICAL EXAM:    11/14/2022   11:04 AM 11/14/2022   10:56 AM 09/05/2022   10:01 AM  Vitals with BMI  Height  _0  _1   Weight  198 lbs 3 oz 201 lbs  BMI  46.28 63.81  Systolic 771 165 790  Diastolic 77 73 81  Pulse 60 61 79   Physical Exam  Constitutional: No distress.  Age appropriate, hemodynamically stable.   Neck: No JVD present.  Cardiovascular: Normal rate, regular rhythm, S1 normal, S2 normal, intact distal pulses and normal pulses. Exam reveals no gallop, no S3 and no S4.  No murmur heard. Pulmonary/Chest: Effort normal and breath sounds normal. No stridor. He has no wheezes. He has no rales.  Abdominal: Soft. Bowel sounds are normal. He exhibits no distension. There is no abdominal tenderness.  Musculoskeletal:        General: No edema.     Cervical back: Neck supple.  Neurological: He is alert and oriented to person, place, and time. He has intact cranial nerves (2-12).  Skin: Skin is warm and moist.   CARDIAC DATABASE: EKG: 04/19/2021 provided by PCP: Atrial flutter, 86 bpm without underlying ischemia or injury pattern. 09/05/2022: Normal sinus rhythm, 80 bpm, normal axis, IRBBB, without underlying ischemia injury pattern 11/14/2022: Sinus bradycardia, right bundle branch block.  Echocardiogram: 05/17/2021: Left ventricle cavity is normal in size. Mild concentric hypertrophy of the left ventricle. Normal global wall motion. Normal LV systolic function with EF 68%. Normal diastolic filling pattern. Moderate (Grade II) mitral regurgitation. Mild tricuspid regurgitation. Estimated pulmonary artery systolic pressure 23  mmHg.   Stress Testing: Exercise tetrofosmin stress test  05/18/2021: Normal ECG stress. The patient exercised for 5 minutes and 44 seconds of a Bruce protocol, achieving approximately 7.05 METs.  achieved 112% of MPHR. Accelerate heart rate response. Hypertensive BP response. Peak BP 216/120 mm Hg. Dyspnea and recovery chest pain noted during exercise stress testing. Rare PVC. Myocardial perfusion is normal. Overall LV systolic function is normal without regional wall motion abnormalities. Stress LV EF: 69%. No previous exam available for comparison. Low risk.  Heart Catheterization: None  LABORATORY DATA: External Labs:  Date Collected: 04/19/2021 , information obtained by PCP.  Potassium: 4.4 Creatinine 1.20 mg/dL. eGFR: 58 mL/min per 1.73 m Hemoglobin: 13.9 g/dL and hematocrit: 41.8 % AST: 18 , ALT: 24 , alkaline phosphatase: 79  TSH: 1.48   Date Collected: 02/08/2021 Lipid profile: Total cholesterol 163 , triglycerides 176 , HDL 62 , LDL 72 Hemoglobin A1c:   External Labs:  Name Result Date Reference Range  Colonoscopy   2021-12-15    Hemoglobin A1C   2021-08-10  Estimated Average Glucose 143      Hemoglobin A1C 6.6   <5.7  Lipid Panel   2021-08-10    Cholesterol 165   <200  Cholesterol / HDL Ratio 2.50   0.00-4.99  HDL Cholesterol 66   >39  LDL Cholesterol (Calculation) 69   <130  LDL/HDL Ratio 1.1   <3.3  Non-HDL Cholesterol 99   <130  Triglycerides 148   <150  Comprehensive Metabolic Panel RS In-house   2021-08-10    Albumin 4.2   3.4-5.0  Albumin/Globulin Ratio 1.4   1.1-2.5  Alkaline Phosphatase 83   25-150  ALT (SGPT) 24   <6-78  AST (SGOT) 21   0-40  Bilirubin, Total 0.6   0.2-1.0  BUN 16   7-18  BUN/Creatinine Ratio 14.8   11.0-26.0  Calcium 9.3   8.5-10.1  Chloride 99   98-107  CO2 30   21-32  Creatinine 1.08   0.70-1.30  GFR/Black 76   >59  GFR/White 65   >59  Globulin, Calculated 2.9   1.5-4.6  Glucose 146   74-106  Potassium 4.6   3.5-5.1   Protein 7.1   6.4-8.2  Sodium 139   136-145   External Labs: Collected: August 29, 2022 performed at PCPs office available in Care Everywhere. A1c 6.7 Total cholesterol 158, triglycerides 96, HDL 70, non-HDL 88, LDL 69 Sodium 136, potassium 4, chloride 100, bicarb 28, BUN 22, creatinine 1.16. AST 23, ALT 20, alkaline phosphatase 76  IMPRESSION:    ICD-10-CM   1. Paroxysmal atrial flutter (HCC)  I48.92 EKG 12-Lead    flecainide (TAMBOCOR) 50 MG tablet    2. Long term (current) use of anticoagulants  Z79.01     3. Long term current use of antiarrhythmic drug  Z79.899 flecainide (TAMBOCOR) 50 MG tablet    4. Benign hypertension  I10     5. Mixed hyperlipidemia  E78.2     6. Hypothyroidism, unspecified type  E03.9     7. Type 2 diabetes mellitus with hyperglycemia, without long-term current use of insulin (HCC)  E11.65     8. Former smoker  Z87.891        RECOMMENDATIONS: HALBERT JESSON is a 81 y.o. male whose past medical history and cardiac risk factors include: Paroxysmal atrial flutter, hypertension, hyperlipidemia, erectile dysfunction, non-insulin-dependent diabetes mellitus type 2, former smoker, advanced age.  Paroxysmal atrial flutter (HCC) Rate control: Metoprolol. Rhythm control: Flecainide Thromboembolic prophylaxis: Xarelto. CHA2DS2-VASc SCORE is 4 which correlates to 4.8% risk of stroke per year (DM, HTN, age > 44). With uptitration of flecainide his burden of atrial fibrillation/flutter as per his iWatch has improved.  However, any change he feels tired, fatigue, and decreased functional capacity. Shared decision is to reduce the flecainide dose down to 50 mg p.o. twice daily. If room mains asymptomatic we will recommend EP evaluation for possible atrial flutter ablation. EKG illustrates sinus bradycardia without any significant ectopy.  Long term (current) use of anticoagulants Indication: Paroxysmal atrial flutter. Reemphasized the risks, benefits,  alternatives to oral anticoagulation. Monitor hemoglobin and hematocrit.  Benign hypertension Office blood pressures are not well-controlled. Home blood pressures are better controlled No changes warranted at this time. Currently managed by primary care provider.  Mixed hyperlipidemia Currently on simvastatin.   He denies myalgia or other side effects. Most recent lipids dated September 2023, independently reviewed as noted above. Currently managed by primary care provider.  FINAL MEDICATION LIST END OF ENCOUNTER: Meds ordered this encounter  Medications  flecainide (TAMBOCOR) 50 MG tablet    Sig: Take 1 tablet (50 mg total) by mouth 2 (two) times daily.    Dispense:  60 tablet    Refill:  0    Medications Discontinued During This Encounter  Medication Reason   flecainide (TAMBOCOR) 100 MG tablet Dose change      Current Outpatient Medications:    albuterol (PROVENTIL HFA;VENTOLIN HFA) 108 (90 Base) MCG/ACT inhaler, Inhale into the lungs every 6 (six) hours as needed for wheezing or shortness of breath., Disp: , Rfl:    Cholecalciferol (VITAMIN D PO), Take 250 mg by mouth daily., Disp: , Rfl:    CINNAMON PO, Take 250 mg by mouth daily., Disp: , Rfl:    Cyanocobalamin (VITAMIN B 12 PO), Take 1 tablet by mouth daily at 6 (six) AM., Disp: , Rfl:    flecainide (TAMBOCOR) 50 MG tablet, Take 1 tablet (50 mg total) by mouth 2 (two) times daily., Disp: 60 tablet, Rfl: 0   levothyroxine (SYNTHROID, LEVOTHROID) 100 MCG tablet, Take 100 mcg by mouth daily before breakfast., Disp: , Rfl: 1   lisinopril-hydrochlorothiazide (ZESTORETIC) 10-12.5 MG tablet, Take 1 tablet by mouth daily., Disp: , Rfl:    metFORMIN (GLUCOPHAGE-XR) 750 MG 24 hr tablet, Take 750 mg by mouth daily., Disp: , Rfl: 0   metoprolol succinate (TOPROL-XL) 25 MG 24 hr tablet, Take 1 tablet (25 mg total) by mouth every morning., Disp: 90 tablet, Rfl: 2   Multiple Vitamins-Minerals (ICAPS AREDS 2) CAPS, Take by mouth  daily., Disp: , Rfl:    sildenafil (VIAGRA) 50 MG tablet, Take 60 mg by mouth daily as needed for erectile dysfunction., Disp: , Rfl:    simvastatin (ZOCOR) 20 MG tablet, Take 20 mg by mouth daily., Disp: , Rfl: 3   XARELTO 20 MG TABS tablet, Take 20 mg by mouth daily., Disp: , Rfl:   Current Facility-Administered Medications:    0.9 %  sodium chloride infusion, 500 mL, Intravenous, Once, Danis, Kirke Corin, MD  Orders Placed This Encounter  Procedures   EKG 12-Lead    There are no Patient Instructions on file for this visit.   --Continue cardiac medications as reconciled in final medication list. --Return in about 4 weeks (around 12/12/2022) for Follow up AFL, AAD, EKG on arrival . Or sooner if needed. --Continue follow-up with your primary care physician regarding the management of your other chronic comorbid conditions.  Patient's questions and concerns were addressed to his satisfaction. He voices understanding of the instructions provided during this encounter.   This note was created using a voice recognition software as a result there may be grammatical errors inadvertently enclosed that do not reflect the nature of this encounter. Every attempt is made to correct such errors.  Rex Kras, [place], [facility]  Pager: [phone] Office: [phone]

## 2022-11-21 ENCOUNTER — Encounter (INDEPENDENT_AMBULATORY_CARE_PROVIDER_SITE_OTHER): Payer: Medicare Other | Admitting: Ophthalmology

## 2022-11-21 DIAGNOSIS — I10 Essential (primary) hypertension: Secondary | ICD-10-CM

## 2022-11-21 DIAGNOSIS — H35033 Hypertensive retinopathy, bilateral: Secondary | ICD-10-CM

## 2022-11-21 DIAGNOSIS — H353122 Nonexudative age-related macular degeneration, left eye, intermediate dry stage: Secondary | ICD-10-CM | POA: Diagnosis not present

## 2022-11-21 DIAGNOSIS — H43813 Vitreous degeneration, bilateral: Secondary | ICD-10-CM | POA: Diagnosis not present

## 2022-11-21 DIAGNOSIS — H353211 Exudative age-related macular degeneration, right eye, with active choroidal neovascularization: Secondary | ICD-10-CM | POA: Diagnosis not present

## 2022-12-10 ENCOUNTER — Other Ambulatory Visit: Payer: Self-pay | Admitting: Cardiology

## 2022-12-10 DIAGNOSIS — I4892 Unspecified atrial flutter: Secondary | ICD-10-CM

## 2022-12-10 DIAGNOSIS — Z79899 Other long term (current) drug therapy: Secondary | ICD-10-CM

## 2022-12-12 ENCOUNTER — Ambulatory Visit: Payer: Medicare Other | Admitting: Cardiology

## 2022-12-15 IMAGING — CR DG HAND COMPLETE 3+V*L*
3 series · 3 of 3 positions shown · non-contrast
Comparison: None.

CLINICAL DATA: Hit left hand on door.  Left hand pain and swelling.

EXAM:
LEFT HAND - COMPLETE 3+ VIEW

[x hand pa left]
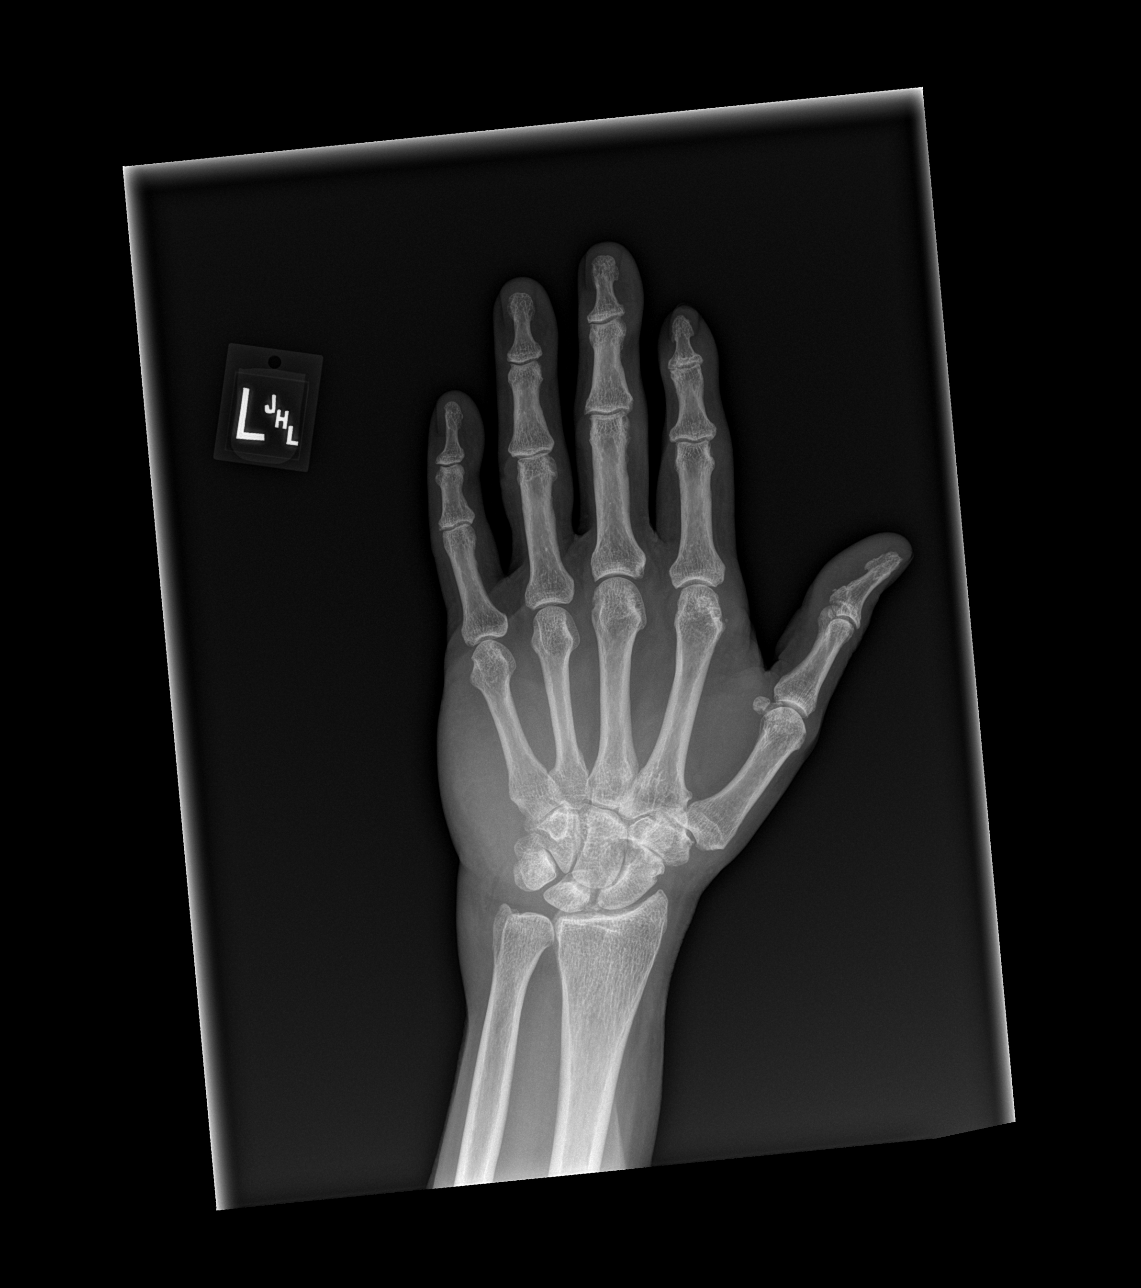

[x hand obl left]
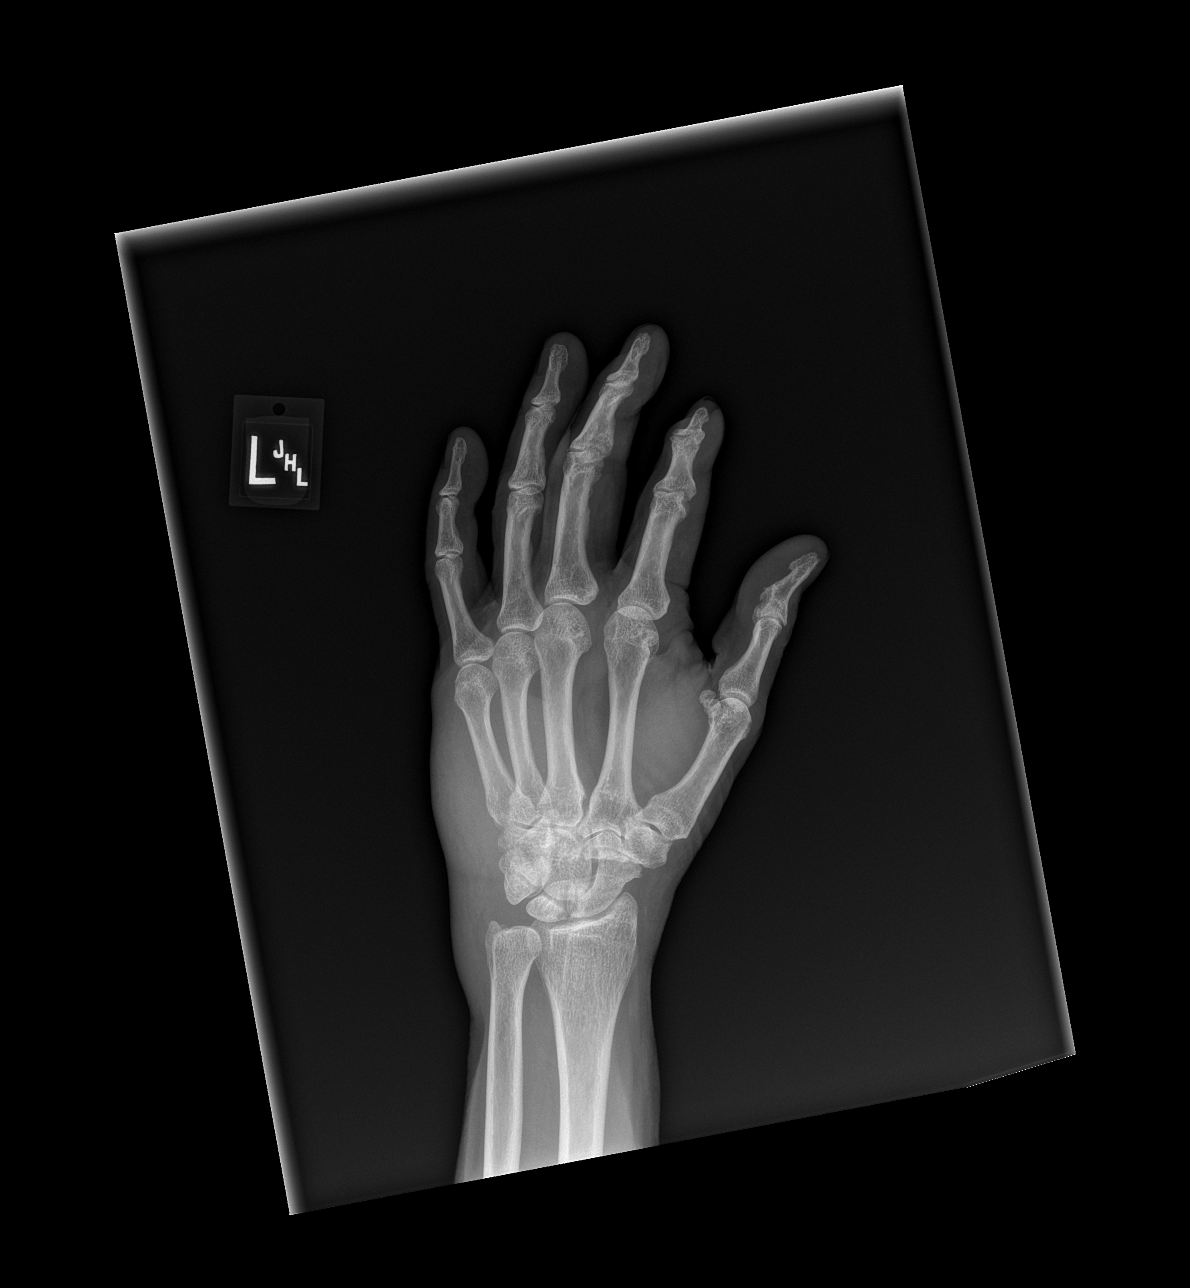

[x hand lat left]
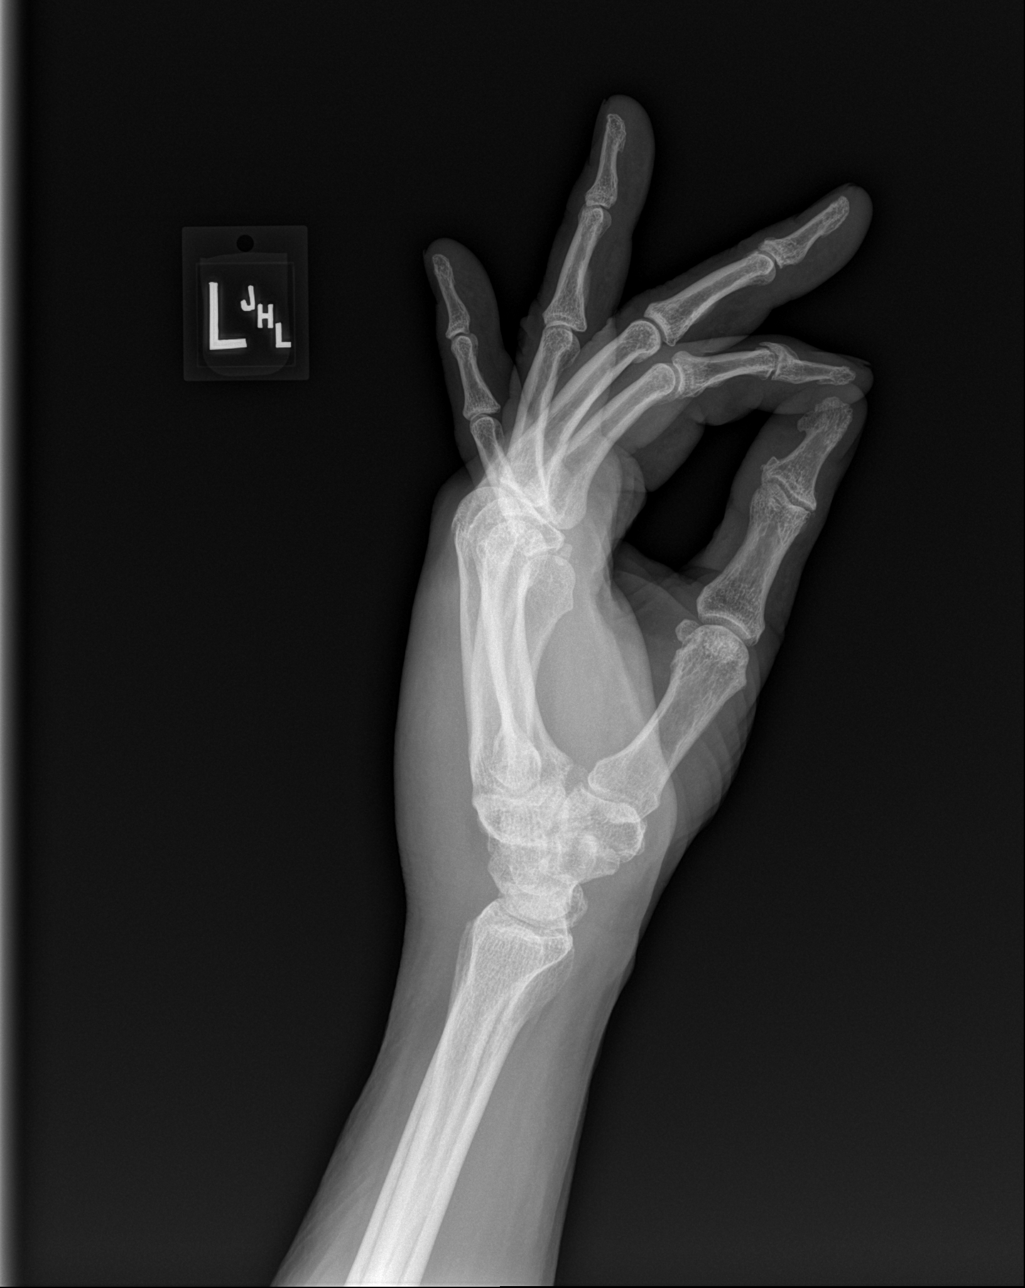

[3 of 3 positions shown; findings below may reference images not displayed]

FINDINGS: There is no evidence of fracture or dislocation. Mild-to-moderate
osteoarthritis is seen involving the DIP joint of the index finger
and IP joint of the thumb. Soft tissue swelling is seen overlying
the 5th metacarpal and distal.
IMPRESSION: Soft tissue swelling. No evidence of fracture.

Interphalangeal joint osteoarthritis involving the thumb and index
finger.

## 2022-12-20 ENCOUNTER — Encounter: Payer: Self-pay | Admitting: Cardiology

## 2022-12-20 ENCOUNTER — Ambulatory Visit: Payer: Medicare Other | Admitting: Cardiology

## 2022-12-20 VITALS — BP 130/80 | HR 65 | Resp 15 | Ht 70.0 in | Wt 196.4 lb

## 2022-12-20 DIAGNOSIS — I4892 Unspecified atrial flutter: Secondary | ICD-10-CM

## 2022-12-20 DIAGNOSIS — Z7901 Long term (current) use of anticoagulants: Secondary | ICD-10-CM | POA: Diagnosis not present

## 2022-12-20 DIAGNOSIS — Z87891 Personal history of nicotine dependence: Secondary | ICD-10-CM

## 2022-12-20 DIAGNOSIS — I10 Essential (primary) hypertension: Secondary | ICD-10-CM

## 2022-12-20 DIAGNOSIS — E039 Hypothyroidism, unspecified: Secondary | ICD-10-CM | POA: Diagnosis not present

## 2022-12-20 DIAGNOSIS — E782 Mixed hyperlipidemia: Secondary | ICD-10-CM

## 2022-12-20 DIAGNOSIS — Z79899 Other long term (current) drug therapy: Secondary | ICD-10-CM

## 2022-12-20 DIAGNOSIS — E1165 Type 2 diabetes mellitus with hyperglycemia: Secondary | ICD-10-CM | POA: Diagnosis not present

## 2022-12-20 NOTE — Progress Notes (Signed)
Date:  12/20/2022   ID:  Isaac Clark, DOB 1941-04-09, MRN [id number]  PCP:  Merrilee Seashore, MD  Cardiologist:  Rex Kras, DO, [facility] (established care 05/04/2021)  Date: 12/20/22 Last Office Visit: 11/14/2022  Chief Complaint  Patient presents with   Atrial Flutter   Follow-up    4 weeks    HPI  Isaac Clark is a 82 y.o. male whose past medical history and cardiovascular risk factors include: Paroxysmal atrial flutter, hypertension, hyperlipidemia, erectile dysfunction, non-insulin-dependent diabetes mellitus type 2, former smoker, advanced age.  Patient being followed by the practice for paroxysmal atrial flutter.  Initially his iWatch noted a A-fib/flutter burden of approximately 40%.  Thereafter we started flecainide and uptitrated to 100 mg p.o. twice daily however, he was feeling tired fatigue and reduced functional capacity.  At the last office visit his flecainide dose was reduced to 50 mg p.o. twice daily.  He was referred to sleep medicine for evaluation for sleep apnea.  Since last office visit patient states that he is doing well from a cardiovascular standpoint.  He is less tired and fatigue.  He still has a flutter/fibrillation burden based on his iWatch ranging between 24-28%.  He is currently attending to his wife's medical concerns and therefore has postponed his sleep study /consultation at [place].   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  ALLERGIES: Allergies  Allergen Reactions   Zithromax [Azithromycin] Diarrhea    MEDICATION LIST PRIOR TO VISIT: Current Meds  Medication Sig   albuterol (PROVENTIL HFA;VENTOLIN HFA) 108 (90 Base) MCG/ACT inhaler Inhale into the lungs every 6 (six) hours as needed for wheezing or shortness of breath.   Cholecalciferol (VITAMIN D PO) Take 250 mg by mouth daily.   CINNAMON PO Take 250 mg by mouth daily.   Cyanocobalamin (VITAMIN B 12 PO) Take  1 tablet by mouth daily at 6 (six) AM.   flecainide (TAMBOCOR) 50 MG tablet TAKE 1 TABLET BY MOUTH TWICE A DAY   levothyroxine (SYNTHROID, LEVOTHROID) 100 MCG tablet Take 100 mcg by mouth daily before breakfast.   lisinopril-hydrochlorothiazide (ZESTORETIC) 10-12.5 MG tablet Take 1 tablet by mouth daily.   metFORMIN (GLUCOPHAGE-XR) 750 MG 24 hr tablet Take 750 mg by mouth daily.   metoprolol succinate (TOPROL-XL) 25 MG 24 hr tablet Take 1 tablet (25 mg total) by mouth every morning.   Multiple Vitamins-Minerals (ICAPS AREDS 2) CAPS Take by mouth daily.   sildenafil (VIAGRA) 50 MG tablet Take 60 mg by mouth daily as needed for erectile dysfunction.   simvastatin (ZOCOR) 20 MG tablet Take 20 mg by mouth daily.   XARELTO 20 MG TABS tablet Take 20 mg by mouth daily.   Current Facility-Administered Medications for the 12/20/22 encounter (Office Visit) with Rex Kras, DO  Medication   0.9 %  sodium chloride infusion     PAST MEDICAL HISTORY: Past Medical History:  Diagnosis Date   Adenomatous colon polyp    Tubularvillous   Allergy    Arthritis    mild   Asthma    Atrial fibrillation ([place])    Atrial flutter ([place])    Cancer ([place])    skin   Diabetes mellitus without complication ([place])    Hyperlipidemia    Hypertension    Thyroid disease     PAST SURGICAL HISTORY: Past Surgical History:  Procedure Laterality Date   CATARACT EXTRACTION Bilateral 2023   COLONOSCOPY  eye injections  11/03/2022   INGUINAL HERNIA REPAIR Bilateral    MEDIAL PARTIAL KNEE REPLACEMENT Left    shoulder impingement surgery Right    TONSILLECTOMY      FAMILY HISTORY: The patient family history includes Heart disease in his father and mother; Hypertension in his father; Stroke in his mother.  SOCIAL HISTORY:  The patient  reports that he quit smoking about 47 years ago. His smoking use included cigarettes. He has a 10.00 pack-year smoking history. He has never used smokeless tobacco. He reports  current alcohol use of about 7.0 standard drinks of alcohol per week. He reports that he does not use drugs.  REVIEW OF SYSTEMS: Review of Systems  Constitutional: Positive for malaise/fatigue (improved).  Cardiovascular:  Negative for chest pain, dyspnea on exertion, irregular heartbeat, leg swelling, orthopnea, palpitations, paroxysmal nocturnal dyspnea and syncope.  Respiratory:  Negative for shortness of breath.     PHYSICAL EXAM:    12/20/2022   10:04 AM 11/14/2022   11:04 AM 11/14/2022   10:56 AM  Vitals with BMI  Height '5\' 10"'$   '5\' 10"'$   Weight 196 lbs 6 oz  198 lbs 3 oz  BMI 58.52  77.82  Systolic 423 536 144  Diastolic 80 77 73  Pulse 65 60 61   Physical Exam  Constitutional: No distress.  Age appropriate, hemodynamically stable.   Neck: No JVD present.  Cardiovascular: Normal rate, regular rhythm, S1 normal, S2 normal, intact distal pulses and normal pulses. Exam reveals no gallop, no S3 and no S4.  No murmur heard. Pulmonary/Chest: Effort normal and breath sounds normal. No stridor. He has no wheezes. He has no rales.  Abdominal: Soft. Bowel sounds are normal. He exhibits no distension. There is no abdominal tenderness.  Musculoskeletal:        General: No edema.     Cervical back: Neck supple.  Neurological: He is alert and oriented to person, place, and time. He has intact cranial nerves (2-12).  Skin: Skin is warm and moist.   CARDIAC DATABASE: EKG: 04/19/2021 provided by PCP: Atrial flutter, 86 bpm without underlying ischemia or injury pattern. 09/05/2022: Normal sinus rhythm, 80 bpm, normal axis, IRBBB, without underlying ischemia injury pattern 11/14/2022: Sinus bradycardia, right bundle branch block. 12/20/2022: Sinus, 62bpm, RBBB.   Echocardiogram: 05/17/2021: Left ventricle cavity is normal in size. Mild concentric hypertrophy of the left ventricle. Normal global wall motion. Normal LV systolic function with EF 68%. Normal diastolic filling  pattern. Moderate (Grade II) mitral regurgitation. Mild tricuspid regurgitation. Estimated pulmonary artery systolic pressure 23 mmHg.   Stress Testing: Exercise tetrofosmin stress test  05/18/2021: Normal ECG stress. The patient exercised for 5 minutes and 44 seconds of a Bruce protocol, achieving approximately 7.05 METs.  achieved 112% of MPHR. Accelerate heart rate response. Hypertensive BP response. Peak BP 216/120 mm Hg. Dyspnea and recovery chest pain noted during exercise stress testing. Rare PVC. Myocardial perfusion is normal. Overall LV systolic function is normal without regional wall motion abnormalities. Stress LV EF: 69%. No previous exam available for comparison. Low risk.  Heart Catheterization: None  LABORATORY DATA: External Labs:  Date Collected: 04/19/2021 , information obtained by PCP.  Potassium: 4.4 Creatinine 1.20 mg/dL. eGFR: 58 mL/min per 1.73 m Hemoglobin: 13.9 g/dL and hematocrit: 41.8 % AST: 18 , ALT: 24 , alkaline phosphatase: 79  TSH: 1.48   Date Collected: 02/08/2021 Lipid profile: Total cholesterol 163 , triglycerides 176 , HDL 62 , LDL 72 Hemoglobin A1c:   External Labs:  Name Result Date Reference Range  Colonoscopy   2021-12-15    Hemoglobin A1C   2021-08-10    Estimated Average Glucose 143      Hemoglobin A1C 6.6   <5.7  Lipid Panel   2021-08-10    Cholesterol 165   <200  Cholesterol / HDL Ratio 2.50   0.00-4.99  HDL Cholesterol 66   >39  LDL Cholesterol (Calculation) 69   <130  LDL/HDL Ratio 1.1   <3.3  Non-HDL Cholesterol 99   <130  Triglycerides 148   <150  Comprehensive Metabolic Panel RS In-house   2021-08-10    Albumin 4.2   3.4-5.0  Albumin/Globulin Ratio 1.4   1.1-2.5  Alkaline Phosphatase 83   25-150  ALT (SGPT) 24   <6-78  AST (SGOT) 21   0-40  Bilirubin, Total 0.6   0.2-1.0  BUN 16   7-18  BUN/Creatinine Ratio 14.8   11.0-26.0  Calcium 9.3   8.5-10.1  Chloride 99   98-107  CO2 30   21-32  Creatinine 1.08    0.70-1.30  GFR/Black 76   >59  GFR/White 65   >59  Globulin, Calculated 2.9   1.5-4.6  Glucose 146   74-106  Potassium 4.6   3.5-5.1  Protein 7.1   6.4-8.2  Sodium 139   136-145   External Labs: Collected: August 29, 2022 performed at PCPs office available in [place]. A1c 6.7 Total cholesterol 158, triglycerides 96, HDL 70, non-HDL 88, LDL 69 Sodium 136, potassium 4, chloride 100, bicarb 28, BUN 22, creatinine 1.16. AST 23, ALT 20, alkaline phosphatase 76  IMPRESSION:    ICD-10-CM   1. Paroxysmal atrial flutter (HCC)  I48.92 EKG 12-Lead    Ambulatory referral to Cardiac Electrophysiology    2. Long term (current) use of anticoagulants  Z79.01 Ambulatory referral to Cardiac Electrophysiology    3. Long term current use of antiarrhythmic drug  Z79.899     4. Benign hypertension  I10     5. Mixed hyperlipidemia  E78.2     6. Hypothyroidism, unspecified type  E03.9     7. Type 2 diabetes mellitus with hyperglycemia, without long-term current use of insulin (HCC)  E11.65     8. Former smoker  Z87.891        RECOMMENDATIONS: Isaac Clark is a 82 y.o. male whose past medical history and cardiac risk factors include: Paroxysmal atrial flutter, hypertension, hyperlipidemia, erectile dysfunction, non-insulin-dependent diabetes mellitus type 2, former smoker, advanced age.  Paroxysmal atrial flutter (HCC) Rate control: Metoprolol. Rhythm control: Flecainide Thromboembolic prophylaxis: Xarelto. CHA2DS2-VASc SCORE is 4 which correlates to 4.8% risk of stroke per year (DM, HTN, age > 35). Doing well on the current dose of flecainide but still feels tired and fatigue.  Based on his iWatch still has an atrial fibrillation/flutter burden around 24-28%.  Educated him on proceeding forward with sleep study evaluation for possible sleep apnea as per schedule. I will refer him to electrophysiology for to discuss candidacy for possible catheter directed ablation versus  different antiarrhythmic options.  Long term (current) use of anticoagulants Indication: Paroxysmal atrial flutter. Reemphasized the risks, benefits, alternatives to oral anticoagulation. Monitor hemoglobin and hematocrit. Is due for labs with PCP in March 2024.  Benign hypertension Office blood pressures are well-controlled. Continue current medical therapy. No changes warranted at this time. Currently managed by primary care provider.  Mixed hyperlipidemia Currently on simvastatin.   He denies myalgia or other side effects. Most recent lipids dated September  2023, independently reviewed as noted above. Currently managed by primary care provider.  FINAL MEDICATION LIST END OF ENCOUNTER: No orders of the defined types were placed in this encounter.   There are no discontinued medications.     Current Outpatient Medications:    albuterol (PROVENTIL HFA;VENTOLIN HFA) 108 (90 Base) MCG/ACT inhaler, Inhale into the lungs every 6 (six) hours as needed for wheezing or shortness of breath., Disp: , Rfl:    Cholecalciferol (VITAMIN D PO), Take 250 mg by mouth daily., Disp: , Rfl:    CINNAMON PO, Take 250 mg by mouth daily., Disp: , Rfl:    Cyanocobalamin (VITAMIN B 12 PO), Take 1 tablet by mouth daily at 6 (six) AM., Disp: , Rfl:    flecainide (TAMBOCOR) 50 MG tablet, TAKE 1 TABLET BY MOUTH TWICE A DAY, Disp: 60 tablet, Rfl: 0   levothyroxine (SYNTHROID, LEVOTHROID) 100 MCG tablet, Take 100 mcg by mouth daily before breakfast., Disp: , Rfl: 1   lisinopril-hydrochlorothiazide (ZESTORETIC) 10-12.5 MG tablet, Take 1 tablet by mouth daily., Disp: , Rfl:    metFORMIN (GLUCOPHAGE-XR) 750 MG 24 hr tablet, Take 750 mg by mouth daily., Disp: , Rfl: 0   metoprolol succinate (TOPROL-XL) 25 MG 24 hr tablet, Take 1 tablet (25 mg total) by mouth every morning., Disp: 90 tablet, Rfl: 2   Multiple Vitamins-Minerals (ICAPS AREDS 2) CAPS, Take by mouth daily., Disp: , Rfl:    sildenafil (VIAGRA) 50 MG  tablet, Take 60 mg by mouth daily as needed for erectile dysfunction., Disp: , Rfl:    simvastatin (ZOCOR) 20 MG tablet, Take 20 mg by mouth daily., Disp: , Rfl: 3   XARELTO 20 MG TABS tablet, Take 20 mg by mouth daily., Disp: , Rfl:   Current Facility-Administered Medications:    0.9 %  sodium chloride infusion, 500 mL, Intravenous, Once, Danis, Kirke Corin, MD  Orders Placed This Encounter  Procedures   Ambulatory referral to Cardiac Electrophysiology   EKG 12-Lead    There are no Patient Instructions on file for this visit.   --Continue cardiac medications as reconciled in final medication list. --Return in about 8 months (around 08/21/2023) for Follow up, Atrial flutter. Or sooner if needed. --Continue follow-up with your primary care physician regarding the management of your other chronic comorbid conditions.  Patient's questions and concerns were addressed to his satisfaction. He voices understanding of the instructions provided during this encounter.   This note was created using a voice recognition software as a result there may be grammatical errors inadvertently enclosed that do not reflect the nature of this encounter. Every attempt is made to correct such errors.  Rex Kras, [place], [facility]  Pager: [phone] Office: [phone]

## 2022-12-26 ENCOUNTER — Encounter (INDEPENDENT_AMBULATORY_CARE_PROVIDER_SITE_OTHER): Payer: Medicare Other | Admitting: Ophthalmology

## 2022-12-26 DIAGNOSIS — H35033 Hypertensive retinopathy, bilateral: Secondary | ICD-10-CM | POA: Diagnosis not present

## 2022-12-26 DIAGNOSIS — H43813 Vitreous degeneration, bilateral: Secondary | ICD-10-CM

## 2022-12-26 DIAGNOSIS — H353122 Nonexudative age-related macular degeneration, left eye, intermediate dry stage: Secondary | ICD-10-CM | POA: Diagnosis not present

## 2022-12-26 DIAGNOSIS — I10 Essential (primary) hypertension: Secondary | ICD-10-CM | POA: Diagnosis not present

## 2022-12-26 DIAGNOSIS — H353211 Exudative age-related macular degeneration, right eye, with active choroidal neovascularization: Secondary | ICD-10-CM | POA: Diagnosis not present

## 2022-12-30 DIAGNOSIS — B353 Tinea pedis: Secondary | ICD-10-CM | POA: Diagnosis not present

## 2022-12-30 DIAGNOSIS — L821 Other seborrheic keratosis: Secondary | ICD-10-CM | POA: Diagnosis not present

## 2022-12-30 DIAGNOSIS — D225 Melanocytic nevi of trunk: Secondary | ICD-10-CM | POA: Diagnosis not present

## 2022-12-30 DIAGNOSIS — B351 Tinea unguium: Secondary | ICD-10-CM | POA: Diagnosis not present

## 2022-12-30 DIAGNOSIS — Z85828 Personal history of other malignant neoplasm of skin: Secondary | ICD-10-CM | POA: Diagnosis not present

## 2022-12-30 DIAGNOSIS — L814 Other melanin hyperpigmentation: Secondary | ICD-10-CM | POA: Diagnosis not present

## 2022-12-30 DIAGNOSIS — Z08 Encounter for follow-up examination after completed treatment for malignant neoplasm: Secondary | ICD-10-CM | POA: Diagnosis not present

## 2023-01-09 ENCOUNTER — Other Ambulatory Visit: Payer: Self-pay | Admitting: Cardiology

## 2023-01-09 DIAGNOSIS — I4892 Unspecified atrial flutter: Secondary | ICD-10-CM

## 2023-01-09 DIAGNOSIS — Z79899 Other long term (current) drug therapy: Secondary | ICD-10-CM

## 2023-01-09 NOTE — Telephone Encounter (Signed)
Are you okay with me filling this?

## 2023-01-30 ENCOUNTER — Encounter (INDEPENDENT_AMBULATORY_CARE_PROVIDER_SITE_OTHER): Payer: Medicare Other | Admitting: Ophthalmology

## 2023-01-30 DIAGNOSIS — H353122 Nonexudative age-related macular degeneration, left eye, intermediate dry stage: Secondary | ICD-10-CM

## 2023-01-30 DIAGNOSIS — H353211 Exudative age-related macular degeneration, right eye, with active choroidal neovascularization: Secondary | ICD-10-CM

## 2023-01-30 DIAGNOSIS — H35033 Hypertensive retinopathy, bilateral: Secondary | ICD-10-CM | POA: Diagnosis not present

## 2023-01-30 DIAGNOSIS — H43813 Vitreous degeneration, bilateral: Secondary | ICD-10-CM

## 2023-01-30 DIAGNOSIS — I10 Essential (primary) hypertension: Secondary | ICD-10-CM

## 2023-02-07 ENCOUNTER — Other Ambulatory Visit: Payer: Self-pay | Admitting: Cardiology

## 2023-02-07 DIAGNOSIS — I4892 Unspecified atrial flutter: Secondary | ICD-10-CM

## 2023-02-07 DIAGNOSIS — Z79899 Other long term (current) drug therapy: Secondary | ICD-10-CM

## 2023-02-07 NOTE — Telephone Encounter (Signed)
Can we refill? 

## 2023-02-08 NOTE — Telephone Encounter (Signed)
Can I refill this?

## 2023-02-08 NOTE — Telephone Encounter (Signed)
Yes  ST

## 2023-02-09 NOTE — Telephone Encounter (Signed)
ST sent 02/08/23

## 2023-02-22 ENCOUNTER — Other Ambulatory Visit: Payer: Self-pay | Admitting: Cardiology

## 2023-02-22 DIAGNOSIS — I4892 Unspecified atrial flutter: Secondary | ICD-10-CM

## 2023-03-06 ENCOUNTER — Other Ambulatory Visit: Payer: Self-pay | Admitting: Cardiology

## 2023-03-06 ENCOUNTER — Encounter (INDEPENDENT_AMBULATORY_CARE_PROVIDER_SITE_OTHER): Payer: Medicare Other | Admitting: Ophthalmology

## 2023-03-06 DIAGNOSIS — H43813 Vitreous degeneration, bilateral: Secondary | ICD-10-CM

## 2023-03-06 DIAGNOSIS — I10 Essential (primary) hypertension: Secondary | ICD-10-CM

## 2023-03-06 DIAGNOSIS — Z79899 Other long term (current) drug therapy: Secondary | ICD-10-CM

## 2023-03-06 DIAGNOSIS — I4892 Unspecified atrial flutter: Secondary | ICD-10-CM

## 2023-03-06 DIAGNOSIS — H353122 Nonexudative age-related macular degeneration, left eye, intermediate dry stage: Secondary | ICD-10-CM

## 2023-03-06 DIAGNOSIS — H35033 Hypertensive retinopathy, bilateral: Secondary | ICD-10-CM | POA: Diagnosis not present

## 2023-03-06 DIAGNOSIS — H353211 Exudative age-related macular degeneration, right eye, with active choroidal neovascularization: Secondary | ICD-10-CM

## 2023-03-06 NOTE — Telephone Encounter (Signed)
Pt needs refill

## 2023-03-08 NOTE — Telephone Encounter (Signed)
[place] sent

## 2023-03-17 DIAGNOSIS — N182 Chronic kidney disease, stage 2 (mild): Secondary | ICD-10-CM | POA: Diagnosis not present

## 2023-03-17 DIAGNOSIS — D692 Other nonthrombocytopenic purpura: Secondary | ICD-10-CM | POA: Diagnosis not present

## 2023-03-17 DIAGNOSIS — E782 Mixed hyperlipidemia: Secondary | ICD-10-CM | POA: Diagnosis not present

## 2023-03-17 DIAGNOSIS — E1122 Type 2 diabetes mellitus with diabetic chronic kidney disease: Secondary | ICD-10-CM | POA: Diagnosis not present

## 2023-03-17 DIAGNOSIS — I4892 Unspecified atrial flutter: Secondary | ICD-10-CM | POA: Diagnosis not present

## 2023-03-17 DIAGNOSIS — I700 Atherosclerosis of aorta: Secondary | ICD-10-CM | POA: Diagnosis not present

## 2023-03-17 DIAGNOSIS — D6869 Other thrombophilia: Secondary | ICD-10-CM | POA: Diagnosis not present

## 2023-03-17 DIAGNOSIS — I10 Essential (primary) hypertension: Secondary | ICD-10-CM | POA: Diagnosis not present

## 2023-03-23 DIAGNOSIS — D692 Other nonthrombocytopenic purpura: Secondary | ICD-10-CM | POA: Diagnosis not present

## 2023-03-23 DIAGNOSIS — H3530 Unspecified macular degeneration: Secondary | ICD-10-CM | POA: Diagnosis not present

## 2023-03-23 DIAGNOSIS — N182 Chronic kidney disease, stage 2 (mild): Secondary | ICD-10-CM | POA: Diagnosis not present

## 2023-03-23 DIAGNOSIS — D6869 Other thrombophilia: Secondary | ICD-10-CM | POA: Diagnosis not present

## 2023-03-23 DIAGNOSIS — E782 Mixed hyperlipidemia: Secondary | ICD-10-CM | POA: Diagnosis not present

## 2023-03-23 DIAGNOSIS — I10 Essential (primary) hypertension: Secondary | ICD-10-CM | POA: Diagnosis not present

## 2023-03-23 DIAGNOSIS — E1122 Type 2 diabetes mellitus with diabetic chronic kidney disease: Secondary | ICD-10-CM | POA: Diagnosis not present

## 2023-03-23 DIAGNOSIS — I700 Atherosclerosis of aorta: Secondary | ICD-10-CM | POA: Diagnosis not present

## 2023-03-23 DIAGNOSIS — I4892 Unspecified atrial flutter: Secondary | ICD-10-CM | POA: Diagnosis not present

## 2023-03-23 DIAGNOSIS — Z0000 Encounter for general adult medical examination without abnormal findings: Secondary | ICD-10-CM | POA: Diagnosis not present

## 2023-03-23 DIAGNOSIS — E039 Hypothyroidism, unspecified: Secondary | ICD-10-CM | POA: Diagnosis not present

## 2023-03-28 DIAGNOSIS — H6122 Impacted cerumen, left ear: Secondary | ICD-10-CM | POA: Diagnosis not present

## 2023-04-04 ENCOUNTER — Other Ambulatory Visit: Payer: Self-pay | Admitting: Cardiology

## 2023-04-04 DIAGNOSIS — Z79899 Other long term (current) drug therapy: Secondary | ICD-10-CM

## 2023-04-04 DIAGNOSIS — I4892 Unspecified atrial flutter: Secondary | ICD-10-CM

## 2023-04-04 NOTE — Telephone Encounter (Signed)
Pt needs refill

## 2023-04-10 ENCOUNTER — Encounter (INDEPENDENT_AMBULATORY_CARE_PROVIDER_SITE_OTHER): Payer: Medicare Other | Admitting: Ophthalmology

## 2023-04-10 DIAGNOSIS — I10 Essential (primary) hypertension: Secondary | ICD-10-CM

## 2023-04-10 DIAGNOSIS — H353211 Exudative age-related macular degeneration, right eye, with active choroidal neovascularization: Secondary | ICD-10-CM | POA: Diagnosis not present

## 2023-04-10 DIAGNOSIS — H35033 Hypertensive retinopathy, bilateral: Secondary | ICD-10-CM | POA: Diagnosis not present

## 2023-04-10 DIAGNOSIS — H353122 Nonexudative age-related macular degeneration, left eye, intermediate dry stage: Secondary | ICD-10-CM | POA: Diagnosis not present

## 2023-04-10 DIAGNOSIS — H43813 Vitreous degeneration, bilateral: Secondary | ICD-10-CM

## 2023-05-05 ENCOUNTER — Other Ambulatory Visit: Payer: Self-pay | Admitting: Cardiology

## 2023-05-05 DIAGNOSIS — Z79899 Other long term (current) drug therapy: Secondary | ICD-10-CM

## 2023-05-05 DIAGNOSIS — I4892 Unspecified atrial flutter: Secondary | ICD-10-CM

## 2023-05-15 ENCOUNTER — Encounter (INDEPENDENT_AMBULATORY_CARE_PROVIDER_SITE_OTHER): Payer: Medicare Other | Admitting: Ophthalmology

## 2023-05-15 DIAGNOSIS — H43813 Vitreous degeneration, bilateral: Secondary | ICD-10-CM | POA: Diagnosis not present

## 2023-05-15 DIAGNOSIS — H353122 Nonexudative age-related macular degeneration, left eye, intermediate dry stage: Secondary | ICD-10-CM | POA: Diagnosis not present

## 2023-05-15 DIAGNOSIS — I10 Essential (primary) hypertension: Secondary | ICD-10-CM | POA: Diagnosis not present

## 2023-05-15 DIAGNOSIS — H353211 Exudative age-related macular degeneration, right eye, with active choroidal neovascularization: Secondary | ICD-10-CM

## 2023-05-15 DIAGNOSIS — H35033 Hypertensive retinopathy, bilateral: Secondary | ICD-10-CM

## 2023-06-05 ENCOUNTER — Other Ambulatory Visit: Payer: Self-pay | Admitting: Cardiology

## 2023-06-05 DIAGNOSIS — I4892 Unspecified atrial flutter: Secondary | ICD-10-CM

## 2023-06-05 DIAGNOSIS — Z79899 Other long term (current) drug therapy: Secondary | ICD-10-CM

## 2023-06-05 NOTE — Telephone Encounter (Signed)
Refill

## 2023-06-19 ENCOUNTER — Encounter (INDEPENDENT_AMBULATORY_CARE_PROVIDER_SITE_OTHER): Payer: Medicare Other | Admitting: Ophthalmology

## 2023-06-19 DIAGNOSIS — H43813 Vitreous degeneration, bilateral: Secondary | ICD-10-CM | POA: Diagnosis not present

## 2023-06-19 DIAGNOSIS — H353122 Nonexudative age-related macular degeneration, left eye, intermediate dry stage: Secondary | ICD-10-CM

## 2023-06-19 DIAGNOSIS — H35033 Hypertensive retinopathy, bilateral: Secondary | ICD-10-CM | POA: Diagnosis not present

## 2023-06-19 DIAGNOSIS — I10 Essential (primary) hypertension: Secondary | ICD-10-CM | POA: Diagnosis not present

## 2023-06-19 DIAGNOSIS — H353211 Exudative age-related macular degeneration, right eye, with active choroidal neovascularization: Secondary | ICD-10-CM | POA: Diagnosis not present

## 2023-07-03 ENCOUNTER — Other Ambulatory Visit: Payer: Self-pay | Admitting: Cardiology

## 2023-07-03 DIAGNOSIS — I4892 Unspecified atrial flutter: Secondary | ICD-10-CM

## 2023-07-03 DIAGNOSIS — Z79899 Other long term (current) drug therapy: Secondary | ICD-10-CM

## 2023-07-03 NOTE — Telephone Encounter (Signed)
Please refill.

## 2023-07-03 NOTE — Telephone Encounter (Signed)
Yes

## 2023-07-08 ENCOUNTER — Other Ambulatory Visit: Payer: Self-pay | Admitting: Cardiology

## 2023-07-08 DIAGNOSIS — Z79899 Other long term (current) drug therapy: Secondary | ICD-10-CM

## 2023-07-08 DIAGNOSIS — I4892 Unspecified atrial flutter: Secondary | ICD-10-CM

## 2023-07-10 NOTE — Telephone Encounter (Signed)
Please refill.

## 2023-07-10 NOTE — Telephone Encounter (Signed)
Yes

## 2023-07-17 ENCOUNTER — Telehealth: Payer: Self-pay

## 2023-07-17 NOTE — Telephone Encounter (Signed)
Patient needs refill on Flecainide

## 2023-07-18 ENCOUNTER — Other Ambulatory Visit: Payer: Self-pay

## 2023-07-18 DIAGNOSIS — Z79899 Other long term (current) drug therapy: Secondary | ICD-10-CM

## 2023-07-18 DIAGNOSIS — I4892 Unspecified atrial flutter: Secondary | ICD-10-CM

## 2023-07-18 MED ORDER — FLECAINIDE ACETATE 50 MG PO TABS: 50.0000 mg | ORAL_TABLET | Freq: Two times a day (BID) | ORAL | 0 refills | Status: DC

## 2023-07-18 NOTE — Telephone Encounter (Signed)
Please refill.  ST

## 2023-07-31 ENCOUNTER — Encounter (INDEPENDENT_AMBULATORY_CARE_PROVIDER_SITE_OTHER): Payer: Medicare Other | Admitting: Ophthalmology

## 2023-07-31 DIAGNOSIS — H353211 Exudative age-related macular degeneration, right eye, with active choroidal neovascularization: Secondary | ICD-10-CM

## 2023-07-31 DIAGNOSIS — H35033 Hypertensive retinopathy, bilateral: Secondary | ICD-10-CM | POA: Diagnosis not present

## 2023-07-31 DIAGNOSIS — I10 Essential (primary) hypertension: Secondary | ICD-10-CM | POA: Diagnosis not present

## 2023-07-31 DIAGNOSIS — H353122 Nonexudative age-related macular degeneration, left eye, intermediate dry stage: Secondary | ICD-10-CM | POA: Diagnosis not present

## 2023-07-31 DIAGNOSIS — H43813 Vitreous degeneration, bilateral: Secondary | ICD-10-CM

## 2023-08-14 ENCOUNTER — Other Ambulatory Visit: Payer: Self-pay | Admitting: Cardiology

## 2023-08-14 DIAGNOSIS — I4892 Unspecified atrial flutter: Secondary | ICD-10-CM

## 2023-08-14 DIAGNOSIS — Z79899 Other long term (current) drug therapy: Secondary | ICD-10-CM

## 2023-08-14 NOTE — Telephone Encounter (Signed)
Please refill.

## 2023-08-17 ENCOUNTER — Other Ambulatory Visit: Payer: Self-pay

## 2023-08-17 DIAGNOSIS — Z79899 Other long term (current) drug therapy: Secondary | ICD-10-CM

## 2023-08-17 DIAGNOSIS — I4892 Unspecified atrial flutter: Secondary | ICD-10-CM

## 2023-08-21 ENCOUNTER — Encounter: Payer: Self-pay | Admitting: Cardiology

## 2023-08-21 ENCOUNTER — Ambulatory Visit: Payer: Medicare Other | Admitting: Cardiology

## 2023-08-21 VITALS — BP 143/77 | HR 73 | Resp 16 | Ht 70.0 in | Wt 199.2 lb

## 2023-08-21 DIAGNOSIS — I4892 Unspecified atrial flutter: Secondary | ICD-10-CM

## 2023-08-21 DIAGNOSIS — Z7901 Long term (current) use of anticoagulants: Secondary | ICD-10-CM | POA: Diagnosis not present

## 2023-08-21 DIAGNOSIS — Z79899 Other long term (current) drug therapy: Secondary | ICD-10-CM

## 2023-08-21 DIAGNOSIS — I10 Essential (primary) hypertension: Secondary | ICD-10-CM | POA: Diagnosis not present

## 2023-08-21 DIAGNOSIS — E782 Mixed hyperlipidemia: Secondary | ICD-10-CM | POA: Diagnosis not present

## 2023-08-21 MED ORDER — FLECAINIDE ACETATE 50 MG PO TABS: 50.0000 mg | ORAL_TABLET | Freq: Two times a day (BID) | ORAL | 1 refills | Status: DC

## 2023-08-21 NOTE — Progress Notes (Signed)
Date:  08/21/2023   ID:  Isaac Clark, DOB 03/01/1941, MRN [id number]  PCP:  Isaac Fick, MD  Cardiologist:  Isaac Lerner, DO, [facility] (established care 05/04/2021)  Date: 08/21/23 Last Office Visit: 12/20/2022  Chief Complaint  Patient presents with   Paroxysmal atrial flutter    Follow-up    8 months    HPI  Isaac Clark is a 82 y.o. male whose past medical history and cardiovascular risk factors include: Paroxysmal atrial flutter, hypertension, hyperlipidemia, erectile dysfunction, non-insulin-dependent diabetes mellitus type 2, former smoker, advanced age.  Patient is being followed by the practice given his history of paroxysmal atrial flutter.  He initially noted his iWatch noting him of A-fib/flutter burden to be close to 40%.  With uptitration of medical therapy his overall atrial fibrillation/flutter burden as per his iWatch has improved significantly.  He is now accounting about 10 to 12% on a weekly basis.  He presents today for 8-month follow-up visit.  He denies anginal chest pain or heart failure symptoms.  He is not feeling as tired or fatigued as he did in the prior visits after reducing the dose of flecainide the symptoms have improved.  No recent labs available for review.  We will see his PCP next month to have them done and send [place] a copy for reference.  We have discussed the importance of being evaluated for sleep apnea.  I have referred him to [facility] sleep; however, patient states that he likely will not be compliant using the device even if he had sleep apnea and therefore does not want to be reevaluated.   FUNCTIONAL STATUS: Walks about 1.0 miles a daily and maintains his lawn via a Actor.  ALLERGIES: Allergies  Allergen Reactions   Zithromax [Azithromycin] Diarrhea    MEDICATION LIST PRIOR TO VISIT: Current Meds  Medication Sig   albuterol (PROVENTIL HFA;VENTOLIN HFA) 108 (90 Base) MCG/ACT inhaler Inhale into the lungs  every 6 (six) hours as needed for wheezing or shortness of breath.   Cholecalciferol (VITAMIN D PO) Take 250 mg by mouth daily.   CINNAMON PO Take 250 mg by mouth daily.   Cyanocobalamin (VITAMIN B 12 PO) Take 1 tablet by mouth daily at 6 (six) AM.   levothyroxine (SYNTHROID, LEVOTHROID) 100 MCG tablet Take 100 mcg by mouth daily before breakfast.   lisinopril-hydrochlorothiazide (ZESTORETIC) 10-12.5 MG tablet Take 1 tablet by mouth daily.   metFORMIN (GLUCOPHAGE-XR) 750 MG 24 hr tablet Take 750 mg by mouth daily.   metoprolol succinate (TOPROL-XL) 25 MG 24 hr tablet TAKE ONE TABLET BY MOUTH EVERY MORNING   Multiple Vitamins-Minerals (ICAPS AREDS 2) CAPS Take by mouth daily.   sildenafil (VIAGRA) 50 MG tablet Take 60 mg by mouth daily as needed for erectile dysfunction.   simvastatin (ZOCOR) 20 MG tablet Take 20 mg by mouth daily.   XARELTO 20 MG TABS tablet Take 20 mg by mouth daily.   [DISCONTINUED] flecainide (TAMBOCOR) 50 MG tablet TAKE 1 TABLET BY MOUTH 2 TIMES A DAY     PAST MEDICAL HISTORY: Past Medical History:  Diagnosis Date   Adenomatous colon polyp    Tubularvillous   Allergy    Arthritis    mild   Asthma    Atrial fibrillation (HCC)    Atrial flutter (HCC)    Cancer (HCC)    skin   Diabetes mellitus without complication (HCC)    Hyperlipidemia    Hypertension    Thyroid disease  PAST SURGICAL HISTORY: Past Surgical History:  Procedure Laterality Date   CATARACT EXTRACTION Bilateral 2023   COLONOSCOPY     eye injections  11/03/2022   INGUINAL HERNIA REPAIR Bilateral    MEDIAL PARTIAL KNEE REPLACEMENT Left    shoulder impingement surgery Right    TONSILLECTOMY      FAMILY HISTORY: The patient family history includes Heart disease in his father and mother; Hypertension in his father; Stroke in his mother.  SOCIAL HISTORY:  The patient  reports that he quit smoking about 47 years ago. His smoking use included cigarettes. He started smoking about 57 years  ago. He has a 10 pack-year smoking history. He has never used smokeless tobacco. He reports current alcohol use of about 7.0 standard drinks of alcohol per week. He reports that he does not use drugs.  REVIEW OF SYSTEMS: Review of Systems  Cardiovascular:  Negative for chest pain, claudication, dyspnea on exertion, irregular heartbeat, leg swelling, near-syncope, orthopnea, palpitations, paroxysmal nocturnal dyspnea and syncope.  Respiratory:  Negative for shortness of breath.   Hematologic/Lymphatic: Negative for bleeding problem.  Musculoskeletal:  Negative for muscle cramps and myalgias.  Neurological:  Negative for dizziness and light-headedness.    PHYSICAL EXAM:    08/21/2023    9:58 AM 12/20/2022   10:04 AM 11/14/2022   11:04 AM  Vitals with BMI  Height 5\' 10"  5\' 10"    Weight 199 lbs 3 oz 196 lbs 6 oz   BMI 28.58 28.18   Systolic 143 130 130  Diastolic 77 80 77  Pulse 73 65 60   Physical Exam  Constitutional: No distress.  Age appropriate, hemodynamically stable.   Neck: No JVD present.  Cardiovascular: Normal rate, regular rhythm, S1 normal, S2 normal, intact distal pulses and normal pulses. Exam reveals no gallop, no S3 and no S4.  No murmur heard. Pulmonary/Chest: Effort normal and breath sounds normal. No stridor. He has no wheezes. He has no rales.  Abdominal: Soft. Bowel sounds are normal. He exhibits no distension. There is no abdominal tenderness.  Musculoskeletal:        General: No edema.     Cervical back: Neck supple.  Neurological: He is alert and oriented to person, place, and time. He has intact cranial nerves (2-12).  Skin: Skin is warm and moist.   CARDIAC DATABASE: EKG: 04/19/2021 provided by PCP: Atrial flutter, 86 bpm without underlying ischemia or injury pattern. 08/21/2023: NSR, 72bpm, RBBB, without underlying injury pattern.   Echocardiogram: 05/17/2021: Left ventricle cavity is normal in size. Mild concentric hypertrophy of the left ventricle.  Normal global wall motion. Normal LV systolic function with EF 68%. Normal diastolic filling pattern. Moderate (Grade II) mitral regurgitation. Mild tricuspid regurgitation. Estimated pulmonary artery systolic pressure 23 mmHg.   Stress Testing: Exercise tetrofosmin stress test  05/18/2021: Normal ECG stress. The patient exercised for 5 minutes and 44 seconds of a Bruce protocol, achieving approximately 7.05 METs.  achieved 112% of MPHR. Accelerate heart rate response. Hypertensive BP response. Peak BP 216/120 mm Hg. Dyspnea and recovery chest pain noted during exercise stress testing. Rare PVC. Myocardial perfusion is normal. Overall LV systolic function is normal without regional wall motion abnormalities. Stress LV EF: 69%. No previous exam available for comparison. Low risk.  Heart Catheterization: None  LABORATORY DATA: External Labs:  Date Collected: 04/19/2021 , information obtained by PCP.  Potassium: 4.4 Creatinine 1.20 mg/dL. eGFR: 58 mL/min per 1.73 m Hemoglobin: 13.9 g/dL and hematocrit: 86.5 % AST: 18 , ALT:  24 , alkaline phosphatase: 79  TSH: 1.48   Date Collected: 02/08/2021 Lipid profile: Total cholesterol 163 , triglycerides 176 , HDL 62 , LDL 72 Hemoglobin A1c:   External Labs:  Name Result Date Reference Range  Colonoscopy   2021-12-15    Hemoglobin A1C   2021-08-10    Estimated Average Glucose 143      Hemoglobin A1C 6.6   <5.7  Lipid Panel   2021-08-10    Cholesterol 165   <200  Cholesterol / HDL Ratio 2.50   0.00-4.99  HDL Cholesterol 66   >39  LDL Cholesterol (Calculation) 69   <130  LDL/HDL Ratio 1.1   <3.3  Non-HDL Cholesterol 99   <130  Triglycerides 148   <150  Comprehensive Metabolic Panel RS In-house   2021-08-10    Albumin 4.2   3.4-5.0  Albumin/Globulin Ratio 1.4   1.1-2.5  Alkaline Phosphatase 83   25-150  ALT (SGPT) 24   <6-78  AST (SGOT) 21   0-40  Bilirubin, Total 0.6   0.2-1.0  BUN 16   7-18  BUN/Creatinine Ratio 14.8   11.0-26.0   Calcium 9.3   8.5-10.1  Chloride 99   98-107  CO2 30   21-32  Creatinine 1.08   0.70-1.30  GFR/Black 76   >59  GFR/White 65   >59  Globulin, Calculated 2.9   1.5-4.6  Glucose 146   74-106  Potassium 4.6   3.5-5.1  Protein 7.1   6.4-8.2  Sodium 139   136-145   External Labs: Collected: August 29, 2022 performed at PCPs office available in Care Everywhere. A1c 6.7 Total cholesterol 158, triglycerides 96, HDL 70, non-HDL 88, LDL 69 Sodium 136, potassium 4, chloride 100, bicarb 28, BUN 22, creatinine 1.16. AST 23, ALT 20, alkaline phosphatase 76  IMPRESSION:    ICD-10-CM   1. Paroxysmal atrial flutter (HCC)  I48.92 EKG 12-Lead    flecainide (TAMBOCOR) 50 MG tablet    2. Long term (current) use of anticoagulants  Z79.01     3. Long term current use of antiarrhythmic drug  Z79.899 flecainide (TAMBOCOR) 50 MG tablet    4. Benign hypertension  I10     5. Mixed hyperlipidemia  E78.2        RECOMMENDATIONS: JOSEHUA LITALIEN is a 82 y.o. male whose past medical history and cardiac risk factors include: Paroxysmal atrial flutter, hypertension, hyperlipidemia, erectile dysfunction, non-insulin-dependent diabetes mellitus type 2, former smoker, advanced age.  Paroxysmal atrial flutter (HCC) Rate control: Metoprolol. Rhythm control: Flecainide Thromboembolic prophylaxis: Xarelto. CHA2DS2-VASc SCORE is 4 which correlates to 4.8% risk of stroke per year (DM, HTN, age > 13). Based on his iWatch still has an atrial fibrillation/flutter burden around 10-12% Does not want to be evaluated for sleep apnea. Educated him on proceeding forward with sleep study evaluation for possible sleep apnea as per schedule.  Long term (current) use of anticoagulants Indication: Paroxysmal atrial flutter. Does not endorse evidence of bleeding. Risks, benefits, alternatives to anticoagulation discussed. No recent labs available for review. He will have labs with PCP in the coming weeks and will send  [place] a copy for reference. Patient is aware to check his renal function and hemoglobin every 6 months given the fact that he is on antiarrhythmics as well as anticoagulation.  Benign hypertension Office blood pressures are acceptable. Home blood pressures are better managed No changes warranted at this time. Currently managed by primary care provider.  Mixed hyperlipidemia Currently on simvastatin.   He  denies myalgia or other side effects. Will have fasting lipids with his PCP in the coming weeks Currently managed by primary care provider.  FINAL MEDICATION LIST END OF ENCOUNTER: Meds ordered this encounter  Medications   flecainide (TAMBOCOR) 50 MG tablet    Sig: Take 1 tablet (50 mg total) by mouth 2 (two) times daily.    Dispense:  180 tablet    Refill:  1    Medications Discontinued During This Encounter  Medication Reason   0.9 %  sodium chloride infusion    flecainide (TAMBOCOR) 50 MG tablet Reorder    Current Outpatient Medications:    albuterol (PROVENTIL HFA;VENTOLIN HFA) 108 (90 Base) MCG/ACT inhaler, Inhale into the lungs every 6 (six) hours as needed for wheezing or shortness of breath., Disp: , Rfl:    Cholecalciferol (VITAMIN D PO), Take 250 mg by mouth daily., Disp: , Rfl:    CINNAMON PO, Take 250 mg by mouth daily., Disp: , Rfl:    Cyanocobalamin (VITAMIN B 12 PO), Take 1 tablet by mouth daily at 6 (six) AM., Disp: , Rfl:    levothyroxine (SYNTHROID, LEVOTHROID) 100 MCG tablet, Take 100 mcg by mouth daily before breakfast., Disp: , Rfl: 1   lisinopril-hydrochlorothiazide (ZESTORETIC) 10-12.5 MG tablet, Take 1 tablet by mouth daily., Disp: , Rfl:    metFORMIN (GLUCOPHAGE-XR) 750 MG 24 hr tablet, Take 750 mg by mouth daily., Disp: , Rfl: 0   metoprolol succinate (TOPROL-XL) 25 MG 24 hr tablet, TAKE ONE TABLET BY MOUTH EVERY MORNING, Disp: 90 tablet, Rfl: 2   Multiple Vitamins-Minerals (ICAPS AREDS 2) CAPS, Take by mouth daily., Disp: , Rfl:    sildenafil (VIAGRA)  50 MG tablet, Take 60 mg by mouth daily as needed for erectile dysfunction., Disp: , Rfl:    simvastatin (ZOCOR) 20 MG tablet, Take 20 mg by mouth daily., Disp: , Rfl: 3   XARELTO 20 MG TABS tablet, Take 20 mg by mouth daily., Disp: , Rfl:    flecainide (TAMBOCOR) 50 MG tablet, Take 1 tablet (50 mg total) by mouth 2 (two) times daily., Disp: 180 tablet, Rfl: 1  Orders Placed This Encounter  Procedures   EKG 12-Lead    There are no Patient Instructions on file for this visit.   --Continue cardiac medications as reconciled in final medication list. --Return in about 6 months (around 02/18/2024) for Follow up, Atrial flutter. Or sooner if needed. --Continue follow-up with your primary care physician regarding the management of your other chronic comorbid conditions.  Patient's questions and concerns were addressed to his satisfaction. He voices understanding of the instructions provided during this encounter.   This note was created using a voice recognition software as a result there may be grammatical errors inadvertently enclosed that do not reflect the nature of this encounter. Every attempt is made to correct such errors.  Isaac Clark, [place], [facility]  Pager: [phone] Office: [phone]

## 2023-09-07 DIAGNOSIS — I700 Atherosclerosis of aorta: Secondary | ICD-10-CM | POA: Diagnosis not present

## 2023-09-07 DIAGNOSIS — D692 Other nonthrombocytopenic purpura: Secondary | ICD-10-CM | POA: Diagnosis not present

## 2023-09-07 DIAGNOSIS — D6869 Other thrombophilia: Secondary | ICD-10-CM | POA: Diagnosis not present

## 2023-09-07 DIAGNOSIS — E1122 Type 2 diabetes mellitus with diabetic chronic kidney disease: Secondary | ICD-10-CM | POA: Diagnosis not present

## 2023-09-07 DIAGNOSIS — I4892 Unspecified atrial flutter: Secondary | ICD-10-CM | POA: Diagnosis not present

## 2023-09-08 LAB — LAB REPORT - SCANNED
Albumin, Urine POC: 3.3
Albumin/Creatinine Ratio, Urine, POC: 5
Creatinine, POC: 67.5 mg/dL
EGFR: 57

## 2023-09-11 ENCOUNTER — Encounter (INDEPENDENT_AMBULATORY_CARE_PROVIDER_SITE_OTHER): Payer: Medicare Other | Admitting: Ophthalmology

## 2023-09-11 DIAGNOSIS — H43813 Vitreous degeneration, bilateral: Secondary | ICD-10-CM | POA: Diagnosis not present

## 2023-09-11 DIAGNOSIS — H353211 Exudative age-related macular degeneration, right eye, with active choroidal neovascularization: Secondary | ICD-10-CM | POA: Diagnosis not present

## 2023-09-11 DIAGNOSIS — H353122 Nonexudative age-related macular degeneration, left eye, intermediate dry stage: Secondary | ICD-10-CM | POA: Diagnosis not present

## 2023-09-11 DIAGNOSIS — H35033 Hypertensive retinopathy, bilateral: Secondary | ICD-10-CM | POA: Diagnosis not present

## 2023-09-11 DIAGNOSIS — I10 Essential (primary) hypertension: Secondary | ICD-10-CM | POA: Diagnosis not present

## 2023-09-14 DIAGNOSIS — E1122 Type 2 diabetes mellitus with diabetic chronic kidney disease: Secondary | ICD-10-CM | POA: Diagnosis not present

## 2023-09-14 DIAGNOSIS — E782 Mixed hyperlipidemia: Secondary | ICD-10-CM | POA: Diagnosis not present

## 2023-09-14 DIAGNOSIS — I700 Atherosclerosis of aorta: Secondary | ICD-10-CM | POA: Diagnosis not present

## 2023-09-14 DIAGNOSIS — Z23 Encounter for immunization: Secondary | ICD-10-CM | POA: Diagnosis not present

## 2023-09-14 DIAGNOSIS — D6869 Other thrombophilia: Secondary | ICD-10-CM | POA: Diagnosis not present

## 2023-09-14 DIAGNOSIS — N182 Chronic kidney disease, stage 2 (mild): Secondary | ICD-10-CM | POA: Diagnosis not present

## 2023-09-14 DIAGNOSIS — D692 Other nonthrombocytopenic purpura: Secondary | ICD-10-CM | POA: Diagnosis not present

## 2023-09-14 DIAGNOSIS — I10 Essential (primary) hypertension: Secondary | ICD-10-CM | POA: Diagnosis not present

## 2023-09-14 DIAGNOSIS — I4892 Unspecified atrial flutter: Secondary | ICD-10-CM | POA: Diagnosis not present

## 2023-09-14 DIAGNOSIS — E039 Hypothyroidism, unspecified: Secondary | ICD-10-CM | POA: Diagnosis not present

## 2023-09-14 DIAGNOSIS — H3530 Unspecified macular degeneration: Secondary | ICD-10-CM | POA: Diagnosis not present

## 2023-10-23 ENCOUNTER — Encounter (INDEPENDENT_AMBULATORY_CARE_PROVIDER_SITE_OTHER): Payer: Medicare Other | Admitting: Ophthalmology

## 2023-10-23 ENCOUNTER — Other Ambulatory Visit: Payer: Self-pay | Admitting: Medical Genetics

## 2023-10-23 DIAGNOSIS — Z006 Encounter for examination for normal comparison and control in clinical research program: Secondary | ICD-10-CM

## 2023-10-23 DIAGNOSIS — H35033 Hypertensive retinopathy, bilateral: Secondary | ICD-10-CM

## 2023-10-23 DIAGNOSIS — H353211 Exudative age-related macular degeneration, right eye, with active choroidal neovascularization: Secondary | ICD-10-CM | POA: Diagnosis not present

## 2023-10-23 DIAGNOSIS — I10 Essential (primary) hypertension: Secondary | ICD-10-CM

## 2023-10-23 DIAGNOSIS — H43813 Vitreous degeneration, bilateral: Secondary | ICD-10-CM | POA: Diagnosis not present

## 2023-10-23 DIAGNOSIS — H353122 Nonexudative age-related macular degeneration, left eye, intermediate dry stage: Secondary | ICD-10-CM

## 2023-11-07 ENCOUNTER — Other Ambulatory Visit (HOSPITAL_COMMUNITY): Payer: Medicare Other

## 2023-11-16 ENCOUNTER — Other Ambulatory Visit: Payer: Self-pay | Admitting: Cardiology

## 2023-11-16 DIAGNOSIS — I4892 Unspecified atrial flutter: Secondary | ICD-10-CM

## 2023-11-27 ENCOUNTER — Encounter (INDEPENDENT_AMBULATORY_CARE_PROVIDER_SITE_OTHER): Payer: Medicare Other | Admitting: Ophthalmology

## 2023-11-27 DIAGNOSIS — H353211 Exudative age-related macular degeneration, right eye, with active choroidal neovascularization: Secondary | ICD-10-CM

## 2023-11-27 DIAGNOSIS — H35033 Hypertensive retinopathy, bilateral: Secondary | ICD-10-CM

## 2023-11-27 DIAGNOSIS — H43813 Vitreous degeneration, bilateral: Secondary | ICD-10-CM

## 2023-11-27 DIAGNOSIS — H353122 Nonexudative age-related macular degeneration, left eye, intermediate dry stage: Secondary | ICD-10-CM

## 2023-11-27 DIAGNOSIS — I10 Essential (primary) hypertension: Secondary | ICD-10-CM

## 2023-12-04 ENCOUNTER — Other Ambulatory Visit (HOSPITAL_COMMUNITY)
Admission: RE | Admit: 2023-12-04 | Discharge: 2023-12-04 | Disposition: A | Discharge status: Home / Self Care | Payer: Self-pay | Source: Ambulatory Visit | Attending: Oncology | Admitting: Oncology

## 2023-12-04 DIAGNOSIS — Z006 Encounter for examination for normal comparison and control in clinical research program: Secondary | ICD-10-CM | POA: Insufficient documentation

## 2023-12-16 LAB — GENECONNECT MOLECULAR SCREEN: Genetic Analysis Overall Interpretation: NEGATIVE

## 2024-01-08 ENCOUNTER — Encounter (INDEPENDENT_AMBULATORY_CARE_PROVIDER_SITE_OTHER): Payer: Medicare Other | Admitting: Ophthalmology

## 2024-01-08 DIAGNOSIS — H353122 Nonexudative age-related macular degeneration, left eye, intermediate dry stage: Secondary | ICD-10-CM | POA: Diagnosis not present

## 2024-01-08 DIAGNOSIS — H353211 Exudative age-related macular degeneration, right eye, with active choroidal neovascularization: Secondary | ICD-10-CM | POA: Diagnosis not present

## 2024-01-08 DIAGNOSIS — H35033 Hypertensive retinopathy, bilateral: Secondary | ICD-10-CM | POA: Diagnosis not present

## 2024-01-08 DIAGNOSIS — I10 Essential (primary) hypertension: Secondary | ICD-10-CM | POA: Diagnosis not present

## 2024-01-08 DIAGNOSIS — H43813 Vitreous degeneration, bilateral: Secondary | ICD-10-CM

## 2024-02-19 ENCOUNTER — Ambulatory Visit: Payer: Medicare Other | Admitting: Cardiology

## 2024-02-20 ENCOUNTER — Encounter (INDEPENDENT_AMBULATORY_CARE_PROVIDER_SITE_OTHER): Payer: Medicare Other | Admitting: Ophthalmology

## 2024-02-20 DIAGNOSIS — H35033 Hypertensive retinopathy, bilateral: Secondary | ICD-10-CM

## 2024-02-20 DIAGNOSIS — H353122 Nonexudative age-related macular degeneration, left eye, intermediate dry stage: Secondary | ICD-10-CM | POA: Diagnosis not present

## 2024-02-20 DIAGNOSIS — H43813 Vitreous degeneration, bilateral: Secondary | ICD-10-CM | POA: Diagnosis not present

## 2024-02-20 DIAGNOSIS — I10 Essential (primary) hypertension: Secondary | ICD-10-CM | POA: Diagnosis not present

## 2024-02-20 DIAGNOSIS — H353211 Exudative age-related macular degeneration, right eye, with active choroidal neovascularization: Secondary | ICD-10-CM | POA: Diagnosis not present

## 2024-02-23 DIAGNOSIS — L821 Other seborrheic keratosis: Secondary | ICD-10-CM | POA: Diagnosis not present

## 2024-02-23 DIAGNOSIS — L814 Other melanin hyperpigmentation: Secondary | ICD-10-CM | POA: Diagnosis not present

## 2024-02-23 DIAGNOSIS — L538 Other specified erythematous conditions: Secondary | ICD-10-CM | POA: Diagnosis not present

## 2024-02-23 DIAGNOSIS — L820 Inflamed seborrheic keratosis: Secondary | ICD-10-CM | POA: Diagnosis not present

## 2024-02-23 DIAGNOSIS — D225 Melanocytic nevi of trunk: Secondary | ICD-10-CM | POA: Diagnosis not present

## 2024-02-23 DIAGNOSIS — Z08 Encounter for follow-up examination after completed treatment for malignant neoplasm: Secondary | ICD-10-CM | POA: Diagnosis not present

## 2024-02-23 DIAGNOSIS — Z85828 Personal history of other malignant neoplasm of skin: Secondary | ICD-10-CM | POA: Diagnosis not present

## 2024-03-20 ENCOUNTER — Ambulatory Visit: Admitting: Cardiology

## 2024-03-29 DIAGNOSIS — E1122 Type 2 diabetes mellitus with diabetic chronic kidney disease: Secondary | ICD-10-CM | POA: Diagnosis not present

## 2024-03-29 DIAGNOSIS — N182 Chronic kidney disease, stage 2 (mild): Secondary | ICD-10-CM | POA: Diagnosis not present

## 2024-03-29 DIAGNOSIS — D6869 Other thrombophilia: Secondary | ICD-10-CM | POA: Diagnosis not present

## 2024-03-29 DIAGNOSIS — E782 Mixed hyperlipidemia: Secondary | ICD-10-CM | POA: Diagnosis not present

## 2024-03-29 DIAGNOSIS — I4892 Unspecified atrial flutter: Secondary | ICD-10-CM | POA: Diagnosis not present

## 2024-03-30 LAB — LAB REPORT - SCANNED
A1c: 6.6
Albumin, Urine POC: 9
Albumin/Creatinine Ratio, Urine, POC: 23
Creatinine, POC: 38.5 mg/dL
EGFR: 72

## 2024-04-05 DIAGNOSIS — D692 Other nonthrombocytopenic purpura: Secondary | ICD-10-CM | POA: Diagnosis not present

## 2024-04-05 DIAGNOSIS — I10 Essential (primary) hypertension: Secondary | ICD-10-CM | POA: Diagnosis not present

## 2024-04-05 DIAGNOSIS — Z0000 Encounter for general adult medical examination without abnormal findings: Secondary | ICD-10-CM | POA: Diagnosis not present

## 2024-04-05 DIAGNOSIS — N182 Chronic kidney disease, stage 2 (mild): Secondary | ICD-10-CM | POA: Diagnosis not present

## 2024-04-05 DIAGNOSIS — E1122 Type 2 diabetes mellitus with diabetic chronic kidney disease: Secondary | ICD-10-CM | POA: Diagnosis not present

## 2024-04-05 DIAGNOSIS — E782 Mixed hyperlipidemia: Secondary | ICD-10-CM | POA: Diagnosis not present

## 2024-04-05 DIAGNOSIS — H3530 Unspecified macular degeneration: Secondary | ICD-10-CM | POA: Diagnosis not present

## 2024-04-05 DIAGNOSIS — E039 Hypothyroidism, unspecified: Secondary | ICD-10-CM | POA: Diagnosis not present

## 2024-04-05 DIAGNOSIS — D6869 Other thrombophilia: Secondary | ICD-10-CM | POA: Diagnosis not present

## 2024-04-08 ENCOUNTER — Encounter (INDEPENDENT_AMBULATORY_CARE_PROVIDER_SITE_OTHER): Admitting: Ophthalmology

## 2024-04-08 DIAGNOSIS — H353122 Nonexudative age-related macular degeneration, left eye, intermediate dry stage: Secondary | ICD-10-CM | POA: Diagnosis not present

## 2024-04-08 DIAGNOSIS — H35033 Hypertensive retinopathy, bilateral: Secondary | ICD-10-CM | POA: Diagnosis not present

## 2024-04-08 DIAGNOSIS — I10 Essential (primary) hypertension: Secondary | ICD-10-CM

## 2024-04-08 DIAGNOSIS — H43813 Vitreous degeneration, bilateral: Secondary | ICD-10-CM | POA: Diagnosis not present

## 2024-04-08 DIAGNOSIS — H353211 Exudative age-related macular degeneration, right eye, with active choroidal neovascularization: Secondary | ICD-10-CM | POA: Diagnosis not present

## 2024-04-11 ENCOUNTER — Encounter: Payer: Self-pay | Admitting: Cardiology

## 2024-04-11 ENCOUNTER — Ambulatory Visit: Attending: Cardiology | Admitting: Cardiology

## 2024-04-11 VITALS — BP 170/88 | HR 68 | Resp 16 | Ht 70.0 in | Wt 205.2 lb

## 2024-04-11 DIAGNOSIS — Z7901 Long term (current) use of anticoagulants: Secondary | ICD-10-CM

## 2024-04-11 DIAGNOSIS — E782 Mixed hyperlipidemia: Secondary | ICD-10-CM | POA: Diagnosis not present

## 2024-04-11 DIAGNOSIS — I10 Essential (primary) hypertension: Secondary | ICD-10-CM | POA: Diagnosis not present

## 2024-04-11 DIAGNOSIS — Z79899 Other long term (current) drug therapy: Secondary | ICD-10-CM

## 2024-04-11 DIAGNOSIS — I4892 Unspecified atrial flutter: Secondary | ICD-10-CM

## 2024-04-11 MED ORDER — FLECAINIDE ACETATE 50 MG PO TABS: 50.0000 mg | ORAL_TABLET | Freq: Two times a day (BID) | ORAL | 1 refills | Status: DC

## 2024-04-11 NOTE — Patient Instructions (Signed)
 Medication Instructions:  Your physician recommends that you continue on your current medications as directed. Please refer to the Current Medication list given to you today.  REFILL for Flecainide  has been sent to your pharmacy.   *If you need a refill on your cardiac medications before your next appointment, please call your pharmacy*  Lab Work: None ordered today. If you have labs (blood work) drawn today and your tests are completely normal, you will receive your results only by: MyChart Message (if you have MyChart) OR A paper copy in the mail If you have any lab test that is abnormal or we need to change your treatment, we will call you to review the results.  Testing/Procedures: To be completed in 6 months: BMP and hemoglobin/hematocrit   Follow-Up: At [facility], you and your health needs are our priority.  As part of our continuing mission to provide you with exceptional heart care, our providers are all part of one team.  This team includes your primary Cardiologist (physician) and Advanced Practice Providers or APPs (Physician Assistants and Nurse Practitioners) who all work together to provide you with the care you need, when you need it.  Your next appointment:   6 month(s)  The format for your next appointment:   In Person  Provider:   Olinda Bertrand, [facility]

## 2024-04-11 NOTE — Progress Notes (Signed)
 Cardiology Office Note:  .   Date:  04/11/2024  ID:  Isaac Clark, DOB [birth year], MRN [id number] PCP:  Virgle Grime, MD  Former Cardiology Providers: None  [facility] Providers Cardiologist:  Olinda Bertrand, DO , [facility] (established care 05/04/2021) Electrophysiologist:  None  Click to update primary MD,subspecialty MD or APP then REFRESH:1}    Chief Complaint  Patient presents with   Follow-up    Follow-up for atrial flutter    History of Present Illness: Isaac Clark is a 83 y.o. Caucasian male whose past medical history and cardiovascular risk factors includes: Paroxysmal atrial flutter, hypertension, hyperlipidemia, erectile dysfunction, non-insulin-dependent diabetes mellitus type 2, former smoker, advanced age.   Patient is being followed by the practice given his history of atrial flutter.  Initially he was noted to have atrial fibrillation/flutter burden as per his iWatch close to 40%.  With up titration of medical therapy his overall burden has improved significantly.  Patient is currently on flecainide , metoprolol , and Xarelto.  Patient presents today for 38-month follow-up visit.  Denies anginal chest pain or heart failure symptoms.  No falls, near-syncope or syncopal events.  Does not endorse any evidence of bleeding. Patient states that his iWatch has been alerting him that his weekly A-fib burden is approximately 7%, significantly improved compared to prior.  Office blood pressures are not well-controlled.  However home blood pressures are usually <130 mmHg.  Of note, he did not take his morning blood pressure medications.  No structured exercise program or daily routine.  Currently takes care of his wife who has cancer.  Review of Systems: .   Review of Systems  Cardiovascular:  Negative for chest pain, claudication, irregular heartbeat, leg swelling, near-syncope, orthopnea, palpitations, paroxysmal nocturnal dyspnea and syncope.  Respiratory:   Negative for shortness of breath.   Hematologic/Lymphatic: Negative for bleeding problem.    Studies Reviewed:   EKG: 04/19/2021 provided by PCP: Atrial flutter, 86 bpm without underlying ischemia or injury pattern.  08/21/2023: NSR, 72bpm, RBBB, without underlying injury pattern.   EKG Interpretation Date/Time:  Thursday Apr 11 2024 09:51:38 EDT Ventricular Rate:  63 PR Interval:  208 QRS Duration:  146 QT Interval:  456 QTC Calculation: 466 R Axis:   -35  Text Interpretation: Normal sinus rhythm Left axis deviation Right bundle branch block Septal infarct , age undetermined No previous ECGs available Confirmed by Olinda Bertrand [id number]) on 04/11/2024 10:05:15 AM  Echocardiogram: 05/17/2021: Left ventricle cavity is normal in size. Mild concentric hypertrophy of the left ventricle. Normal global wall motion. Normal LV systolic function with EF 68%. Normal diastolic filling pattern. Moderate (Grade II) mitral regurgitation. Mild tricuspid regurgitation. Estimated pulmonary artery systolic pressure 23 mmHg.  Stress Testing: Exercise tetrofosmin stress test  05/18/2021: Low risk.  RADIOLOGY: NA  Risk Assessment/Calculations:   Click Here to Calculate/Change CHADS2VASc Score The patient's CHADS2-VASc score is 5, indicating a 7.2% annual risk of stroke.  CHF History: No HTN History: Yes Diabetes History: Yes Stroke History: No Vascular Disease History: Yes   Labs:   External Labs: Collected: August 29, 2022 performed at PCPs office available in Care Everywhere. A1c 6.7 Total cholesterol 158, triglycerides 96, HDL 70, non-HDL 88, LDL 69 Sodium 136, potassium 4, chloride 100, bicarb 28, BUN 22, creatinine 1.16. AST 23, ALT 20, alkaline phosphatase 76  Collected: April 2025 by PCP, provided by the patient. Hemoglobin 14.7, hematocrit 45.7%. TSH 1.3. Total cholesterol 163, triglycerides 124, HDL 77, calculated LDL 64. BUN  16, creatinine 1.05   Physical Exam:     Today's Vitals   04/11/24 0948 04/11/24 0950  BP: (!) 198/90 (!) 170/88  Pulse: 68   Resp: 16   SpO2: 96%   Weight: 205 lb 3.2 oz (93.1 kg)   Height: 5\' 10"  (1.778 m)    Body mass index is 29.44 kg/m. Wt Readings from Last 3 Encounters:  04/11/24 205 lb 3.2 oz (93.1 kg)  08/21/23 199 lb 3.2 oz (90.4 kg)  12/20/22 196 lb 6.4 oz (89.1 kg)    Physical Exam  Constitutional: No distress.  Age appropriate, hemodynamically stable.   Neck: No JVD present.  Cardiovascular: Normal rate, regular rhythm, S1 normal, S2 normal, intact distal pulses and normal pulses. Exam reveals no gallop, no S3 and no S4.  No murmur heard. Pulmonary/Chest: Effort normal and breath sounds normal. No stridor. He has no wheezes. He has no rales.  Abdominal: Soft. Bowel sounds are normal. He exhibits no distension. There is no abdominal tenderness.  Musculoskeletal:        General: No edema.     Cervical back: Neck supple.  Neurological: He is alert and oriented to person, place, and time. He has intact cranial nerves (2-12).  Skin: Skin is warm and moist.     Impression:   ICD-10-CM   1. Paroxysmal atrial flutter (HCC)  I48.92 EKG 12-Lead    Basic metabolic panel with GFR    Hemoglobin and hematocrit, blood    flecainide  (TAMBOCOR ) 50 MG tablet    Hemoglobin and hematocrit, blood    Basic metabolic panel with GFR    2. Long term (current) use of anticoagulants  Z79.01     3. Long term current use of antiarrhythmic drug  Z79.899 Basic metabolic panel with GFR    Hemoglobin and hematocrit, blood    flecainide  (TAMBOCOR ) 50 MG tablet    Hemoglobin and hematocrit, blood    Basic metabolic panel with GFR    4. Benign hypertension  I10 Basic metabolic panel with GFR    Basic metabolic panel with GFR    5. Mixed hyperlipidemia  E78.2        Recommendation(s):  Paroxysmal atrial flutter (HCC) Rate control: Metoprolol  succinate 25 mg p.o. daily. Rhythm control: Flecainide  50 mg p.o. twice  daily. Thromboembolic prophylaxis: Xarelto 20 mg p.o. supper His iWatch has noted a weekly atrial fibrillation burden of approximately 7%, and he is to be close to 12% in the past. Patient does not recall the exact number for his average pulse on a weekly basis but states that he does get low readings especially while asleep close to 40 bpm.  Will hold off on up titration of AV nodal blocking agents In the past we did uptitrate flecainide  which she did not tolerate well. He is also refused to be evaluated for sleep apnea. He wants to hold off on seeing EP for consideration of atrial flutter ablation.  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  Long term (current) use of anticoagulants Currently on Xarelto. Does not endorse evidence of bleeding. Labs performed with PCP, provided by the patient independently reviewed.  Hemoglobin is stable. Will check H&H and BMP prior to next office visit  Long term current use of antiarrhythmic drug Currently on flecainide . Renal function and electrolytes are optimized Flecainide  refilled  Benign hypertension Office blood pressures are not at goal. Home blood pressures are consistently <130 mmHg.  Likely secondary to not  taking his morning pills prior to arrival Reemphasized importance of low-salt diet.  Mixed hyperlipidemia Currently on simvastatin 20 mg p.o. daily.   He denies myalgia or other side effects. Most recent lipids dated April 2025, independently reviewed as noted above.  LDL is 65 mg/dL.  Cardiology is following peripherally.  Orders Placed:  Orders Placed This Encounter  Procedures   Basic metabolic panel with GFR    Standing Status:   Future    Number of Occurrences:   1    Expected Date:   10/12/2024    Expiration Date:   04/11/2025   Hemoglobin and hematocrit, blood    Standing Status:   Future    Number of Occurrences:   1    Expected Date:   10/12/2024    Expiration Date:   04/11/2025   EKG  12-Lead   Final Medication List:    Meds ordered this encounter  Medications   flecainide  (TAMBOCOR ) 50 MG tablet    Sig: Take 1 tablet (50 mg total) by mouth 2 (two) times daily.    Dispense:  180 tablet    Refill:  1    Medications Discontinued During This Encounter  Medication Reason   flecainide  (TAMBOCOR ) 50 MG tablet Reorder     Current Outpatient Medications:    albuterol (PROVENTIL HFA;VENTOLIN HFA) 108 (90 Base) MCG/ACT inhaler, Inhale into the lungs every 6 (six) hours as needed for wheezing or shortness of breath., Disp: , Rfl:    Cholecalciferol (VITAMIN D PO), Take 250 mg by mouth daily., Disp: , Rfl:    CINNAMON PO, Take 250 mg by mouth daily., Disp: , Rfl:    Cyanocobalamin (VITAMIN B 12 PO), Take 1 tablet by mouth daily at 6 (six) AM., Disp: , Rfl:    levothyroxine (SYNTHROID, LEVOTHROID) 100 MCG tablet, Take 100 mcg by mouth daily before breakfast., Disp: , Rfl: 1   lisinopril -hydrochlorothiazide (ZESTORETIC) 10-12.5 MG tablet, Take 1 tablet by mouth daily., Disp: , Rfl:    metFORMIN (GLUCOPHAGE-XR) 750 MG 24 hr tablet, Take 750 mg by mouth daily., Disp: , Rfl: 0   metoprolol  succinate (TOPROL -XL) 25 MG 24 hr tablet, TAKE 1 TABLET BY MOUTH EVERY MORNING, Disp: 90 tablet, Rfl: 2   Multiple Vitamins-Minerals (ICAPS AREDS 2) CAPS, Take by mouth daily., Disp: , Rfl:    sildenafil (VIAGRA) 50 MG tablet, Take 60 mg by mouth daily as needed for erectile dysfunction., Disp: , Rfl:    simvastatin (ZOCOR) 20 MG tablet, Take 20 mg by mouth daily., Disp: , Rfl: 3   XARELTO 20 MG TABS tablet, Take 20 mg by mouth daily., Disp: , Rfl:    flecainide  (TAMBOCOR ) 50 MG tablet, Take 1 tablet (50 mg total) by mouth 2 (two) times daily., Disp: 180 tablet, Rfl: 1  Consent:   NA  Disposition:   49-month follow-up sooner if needed  His questions and concerns were addressed to his satisfaction. He voices understanding of the recommendations provided during this encounter.     Signed, Awilda Bogus, [facility]  [facility]  04/11/2024 10:37 AM

## 2024-05-27 ENCOUNTER — Encounter (INDEPENDENT_AMBULATORY_CARE_PROVIDER_SITE_OTHER): Admitting: Ophthalmology

## 2024-05-27 DIAGNOSIS — H43813 Vitreous degeneration, bilateral: Secondary | ICD-10-CM

## 2024-05-27 DIAGNOSIS — H353211 Exudative age-related macular degeneration, right eye, with active choroidal neovascularization: Secondary | ICD-10-CM

## 2024-05-27 DIAGNOSIS — H353122 Nonexudative age-related macular degeneration, left eye, intermediate dry stage: Secondary | ICD-10-CM

## 2024-05-27 DIAGNOSIS — I10 Essential (primary) hypertension: Secondary | ICD-10-CM

## 2024-05-27 DIAGNOSIS — H35033 Hypertensive retinopathy, bilateral: Secondary | ICD-10-CM | POA: Diagnosis not present

## 2024-06-10 ENCOUNTER — Telehealth: Payer: Self-pay | Admitting: Cardiology

## 2024-06-10 NOTE — Telephone Encounter (Signed)
 Spoke with Pt. Pt wanted to make a appointment with Dr Michele, pt stated he wanted to talk to him more about referral for Afib ablation.  Appointment made.

## 2024-06-10 NOTE — Telephone Encounter (Signed)
 Patient c/o Palpitations:  STAT if patient reporting lightheadedness, shortness of breath, or chest pain  How long have you had palpitations/irregular HR/ Afib? Are you having the symptoms now? Afib fluctuating the past few weeks. States it has been happening 97% of the time. Having symptoms now  Are you currently experiencing lightheadedness, SOB or CP? Lightheaded and fatigue  Do you have a history of afib (atrial fibrillation) or irregular heart rhythm? Yes, afib  Have you checked your BP or HR? (document readings if available): 80-112 while resting but BP is normal  Are you experiencing any other symptoms? Lightheadedness and fatigue

## 2024-06-27 ENCOUNTER — Encounter: Payer: Self-pay | Admitting: Cardiology

## 2024-06-27 ENCOUNTER — Ambulatory Visit: Attending: Cardiology | Admitting: Cardiology

## 2024-06-27 VITALS — BP 135/82 | HR 92 | Resp 16 | Ht 70.0 in | Wt 202.6 lb

## 2024-06-27 DIAGNOSIS — E782 Mixed hyperlipidemia: Secondary | ICD-10-CM | POA: Diagnosis not present

## 2024-06-27 DIAGNOSIS — Z79899 Other long term (current) drug therapy: Secondary | ICD-10-CM | POA: Diagnosis not present

## 2024-06-27 DIAGNOSIS — I4892 Unspecified atrial flutter: Secondary | ICD-10-CM

## 2024-06-27 DIAGNOSIS — I10 Essential (primary) hypertension: Secondary | ICD-10-CM | POA: Diagnosis not present

## 2024-06-27 DIAGNOSIS — Z7901 Long term (current) use of anticoagulants: Secondary | ICD-10-CM

## 2024-06-27 NOTE — Patient Instructions (Addendum)
 Medication Instructions:  No medication changes were made at this visit. Continue current regimen.   *If you need a refill on your cardiac medications before your next appointment, please call your pharmacy*  Lab Work: CBC and BMP today   If you have labs (blood work) drawn today and your tests are completely normal, you will receive your results only by: MyChart Message (if you have MyChart) OR A paper copy in the mail If you have any lab test that is abnormal or we need to change your treatment, we will call you to review the results.  Testing/Procedures: Your physician has recommended that you have a Cardioversion (DCCV). Electrical Cardioversion uses a jolt of electricity to your heart either through paddles or wired patches attached to your chest. This is a controlled, usually prescheduled, procedure. Defibrillation is done under light anesthesia in the hospital, and you usually go home the day of the procedure. This is done to get your heart back into a normal rhythm. You are not awake for the procedure. Please see the instruction sheet given to you today.   Follow-Up: At [facility], you and your health needs are our priority.  As part of our continuing mission to provide you with exceptional heart care, our providers are all part of one team.  This team includes your primary Cardiologist (physician) and Advanced Practice Providers or APPs (Physician Assistants and Nurse Practitioners) who all work together to provide you with the care you need, when you need it.  Your next appointment:   6 month(s)  Provider:   [name], DO           Dear Isaac Clark  You are scheduled for a Cardioversion on Monday, July 28 with Dr. Large.  Please arrive at the [facility] (Main Entrance A) at [facility]: [address] [place], [place] [address] at 7:30 AM (This time is ONE hour(s) before your procedure to ensure your preparation).   Free valet parking service is  available. You will check in at ADMITTING.   *Please Note: You will receive a call the day before your procedure to confirm the appointment time. That time may have changed from the original time based on the schedule for that day.*    DIET:  Nothing to eat or drink after midnight except a sip of water with medications (see medication instructions below)  MEDICATION INSTRUCTIONS:  Continue taking your anticoagulant (blood thinner): Rivaroxaban (Xarelto).  You will need to continue this after your procedure until you are told by your provider that it is safe to stop.    LABS:   Come to the lab at the [facility] ([address], [place], 1st Floor) between the hours of 8:00 am and 4:30 pm. You do NOT have to be fasting.  FYI:  For your safety, and to allow us  to monitor your vital signs accurately during the surgery/procedure we request: If you have artificial nails, gel coating, SNS etc, please have those removed prior to your surgery/procedure. Not having the nail coverings /polish removed may result in cancellation or delay of your surgery/procedure.  Your support person will be asked to wait in the waiting room during your procedure.  It is OK to have someone drop you off and come back when you are ready to be discharged.  You cannot drive after the procedure and will need someone to drive you home.  Bring your insurance cards.  *Special  Note: Every effort is made to have your procedure done on time. Occasionally there are emergencies that occur at the hospital that may cause delays. Please be patient if a delay does occur.

## 2024-06-27 NOTE — Progress Notes (Signed)
 Cardiology Office Note:  .    ID:  Isaac Clark, DOB 02-16-1941, MRN [id number] PCP:  [name], MD  Former Cardiology Providers: None  [facility] Providers Cardiologist:  [name], DO , [facility] (established care 05/04/2021) Electrophysiologist:  None  Click to update primary MD,subspecialty MD or APP then REFRESH:1}    Chief Complaint  Patient presents with   Follow-up   Paroxysmal atrial flutter     History of Present Illness: Isaac Clark is a 83 y.o. Caucasian male whose past medical history and cardiovascular risk factors includes: Atrial flutter, hypertension, hyperlipidemia, erectile dysfunction, non-insulin-dependent diabetes mellitus type 2, former smoker, advanced age.   Patient has known history of atrial flutter which has been monitoring at home with his iWatch.  Initially his A-fib/flutter burden was up close to 40% with a titration up AV nodal blocking agents and antiarrhythmic overall burden improved.  He was last seen in the office in September 2024.  Patient presents today for regular follow-up.  He has been in A-fib for the last 3 weeks at least and his heart rate is consistently elevated greater than 127 bpm at times. He is currently on Xarelto for thromboembolic prophylaxis without interruption over the last 3 weeks, metoprolol , and flecainide .  Patient states that clinically he feels tired, fatigued, and shortness of breath with minimal activity such as climbing 2 steps and ambulating at home.  Patient is willing to be evaluated by EP for possible catheter directed ablation and consider cardioversion in the interim.  Unfortunately, recently had a mechanical fall in which he injured his upper lip when he hit the floor.  Patient states that his head did not hit the floor during this occurrence.  Review of Systems: .   Review of Systems  Constitutional: Positive for malaise/fatigue.  Cardiovascular:  Positive for dyspnea on exertion,  irregular heartbeat and palpitations. Negative for chest pain, claudication, leg swelling, near-syncope, orthopnea, paroxysmal nocturnal dyspnea and syncope.  Respiratory:  Positive for shortness of breath.   Hematologic/Lymphatic: Negative for bleeding problem.    Studies Reviewed:   EKG: 04/19/2021 provided by PCP: Atrial flutter, 86 bpm without underlying ischemia or injury pattern.  08/21/2023: NSR, 72bpm, RBBB, without underlying injury pattern.   EKG Interpretation Date/Time:  Thursday June 27 2024 15:27:33 EDT Ventricular Rate:  86 PR Interval:    QRS Duration:  152 QT Interval:  404 QTC Calculation: 483 R Axis:   -54  Text Interpretation: Atrial fibrillation Right bundle branch block Left anterior fascicular block Bifascicular block Septal infarct (cited on or before 11-Apr-2024) When compared with ECG of 11-Apr-2024 09:51, Atrial fibrillation has replaced Sinus rhythm Confirmed by Large Madonna [phone]) on 06/27/2024 3:37:48 PM  Echocardiogram: 05/17/2021: Left ventricle cavity is normal in size. Mild concentric hypertrophy of the left ventricle. Normal global wall motion. Normal LV systolic function with EF 68%. Normal diastolic filling pattern. Moderate (Grade II) mitral regurgitation. Mild tricuspid regurgitation. Estimated pulmonary artery systolic pressure 23 mmHg.  Stress Testing: Exercise tetrofosmin stress test  05/18/2021: Low risk.  RADIOLOGY: NA  Risk Assessment/Calculations:   Click Here to Calculate/Change CHADS2VASc Score The patient's CHADS2-VASc score is 5, indicating a 7.2% annual risk of stroke.  CHF History: No HTN History: Yes Diabetes History: Yes Stroke History: No Vascular Disease History: Yes  Labs:   External Labs: Collected: August 29, 2022 performed at PCPs office available in Care Everywhere. A1c 6.7 Total cholesterol 158, triglycerides 96, HDL 70, non-HDL 88, LDL 69  Sodium 136, potassium 4, chloride 100, bicarb 28, BUN 22,  creatinine 1.16. AST 23, ALT 20, alkaline phosphatase 76  Collected: April 2025 by PCP, provided by the patient. Hemoglobin 14.7, hematocrit 45.7%. TSH 1.3. Total cholesterol 163, triglycerides 124, HDL 77, calculated LDL 64. BUN 16, creatinine 1.05   Physical Exam:    Today's Vitals   06/27/24 1522 06/27/24 1547  BP: (!) 163/111 135/82  Pulse: 87 92  Resp: 16   SpO2: 98%   Weight: 202 lb 9.6 oz (91.9 kg)   Height: 5' 10 (1.778 m)    Body mass index is 29.07 kg/m. Wt Readings from Last 3 Encounters:  06/27/24 202 lb 9.6 oz (91.9 kg)  04/11/24 205 lb 3.2 oz (93.1 kg)  08/21/23 199 lb 3.2 oz (90.4 kg)    Physical Exam  Constitutional: No distress.  Age appropriate, hemodynamically stable.   HENT:  Dry blood upper lip  Neck: No JVD present.  Cardiovascular: Normal rate, S1 normal, S2 normal, intact distal pulses and normal pulses. An irregularly irregular rhythm present. Exam reveals no gallop, no S3 and no S4.  No murmur heard. Pulmonary/Chest: Effort normal and breath sounds normal. No stridor. He has no wheezes. He has no rales.  Abdominal: Soft. Bowel sounds are normal. He exhibits no distension. There is no abdominal tenderness.  Musculoskeletal:        General: No edema.     Cervical back: Neck supple.  Neurological: He is alert and oriented to person, place, and time. He has intact cranial nerves (2-12).  Skin: Skin is warm and moist.     Impression:   ICD-10-CM   1. Atrial flutter, chronic (HCC)  I48.92 EKG 12-Lead    CBC    Basic Metabolic Panel (BMET)    Ambulatory referral to Cardiac Electrophysiology    Basic Metabolic Panel (BMET)    CBC    2. Long term (current) use of anticoagulants  Z79.01     3. Long term current use of antiarrhythmic drug  Z79.899     4. Benign hypertension  I10     5. Mixed hyperlipidemia  E78.2        Recommendation(s):  Persistent atrial flutter (HCC) Rate control: Metoprolol  succinate 25 mg p.o. daily. Rhythm  control: Flecainide  50 mg p.o. twice daily. Thromboembolic prophylaxis: Xarelto 20 mg p.o. supper He is to monitor his atrial fibrillation/flutter burden with his iWatch. He has been in atrial fibrillation according to his iWatch for the last several weeks despite being on medical therapy.  Now he is symptomatic with feeling tired, fatigue, shortness of breath with minimal activity.   Patient is requesting referral to EP for consideration of catheter directed ablation.  Consult placed for Dr. Kennyth. In the interim patient is willing to undergo cardioversion to hopefully restore/maintain sinus rhythm given the fact that he is symptomatic with his persistent atrial flutter. In the past we did uptitrate flecainide  which she did not tolerate well. He is also refused to be evaluated for sleep apnea. Check CBC and BMP Risks, benefits, alternatives to direct-current cardioversion discussed  Long term (current) use of anticoagulants Currently on Xarelto. Had a recent mechanical fall leading to a lip injury; otherwise, does not endorse any other evidence of bleeding.  Long term current use of antiarrhythmic drug Currently on flecainide . Renal function and electrolytes are optimized Did not tolerate higher dose of flecainide  in the past  Benign hypertension Office blood pressures are not at goal. Home blood pressures are consistently <  130 mmHg. Reemphasized importance of low-salt diet.   Cardiology following peripherally, managed by primary care provider.  Mixed hyperlipidemia Currently on simvastatin 20 mg p.o. daily.   He denies myalgia or other side effects. Most recent lipids dated April 2025, independently reviewed as noted above.  LDL is 65 mg/dL.  Cardiology is following peripherally.  Orders Placed:  Orders Placed This Encounter  Procedures   CBC    Standing Status:   Future    Number of Occurrences:   1    Expected Date:   06/27/2024    Expiration Date:   06/27/2025   Basic  Metabolic Panel (BMET)    Standing Status:   Future    Number of Occurrences:   1    Expected Date:   06/27/2024    Expiration Date:   06/27/2025   Ambulatory referral to Cardiac Electrophysiology    Referral Priority:   Urgent    Referral Type:   Consultation    Referral Reason:   Specialty Services Required    Referred to Provider:   [name], MD    Requested Specialty:   Cardiology    Number of Visits Requested:   1   EKG 12-Lead   Final Medication List:    No orders of the defined types were placed in this encounter.   There are no discontinued medications.    Current Outpatient Medications:    albuterol (PROVENTIL HFA;VENTOLIN HFA) 108 (90 Base) MCG/ACT inhaler, Inhale 1-2 puffs into the lungs every 6 (six) hours as needed for wheezing or shortness of breath., Disp: , Rfl:    Cholecalciferol (VITAMIN D PO), Take 1 tablet by mouth daily., Disp: , Rfl:    CINNAMON PO, Take 250 mg by mouth daily., Disp: , Rfl:    Cyanocobalamin (VITAMIN B 12 PO), Take 1 tablet by mouth daily., Disp: , Rfl:    flecainide  (TAMBOCOR ) 50 MG tablet, Take 1 tablet (50 mg total) by mouth 2 (two) times daily., Disp: 180 tablet, Rfl: 1   levothyroxine (SYNTHROID, LEVOTHROID) 100 MCG tablet, Take 100 mcg by mouth daily before breakfast., Disp: , Rfl: 1   lisinopril -hydrochlorothiazide (ZESTORETIC) 10-12.5 MG tablet, Take 1 tablet by mouth daily., Disp: , Rfl:    metFORMIN (GLUCOPHAGE-XR) 750 MG 24 hr tablet, Take 750 mg by mouth daily., Disp: , Rfl: 0   metoprolol  succinate (TOPROL -XL) 25 MG 24 hr tablet, TAKE 1 TABLET BY MOUTH EVERY MORNING, Disp: 90 tablet, Rfl: 2   sildenafil (VIAGRA) 50 MG tablet, Take 60 mg by mouth daily as needed for erectile dysfunction., Disp: , Rfl:    simvastatin (ZOCOR) 20 MG tablet, Take 20 mg by mouth daily., Disp: , Rfl: 3   XARELTO 20 MG TABS tablet, Take 20 mg by mouth daily., Disp: , Rfl:    Multiple Vitamins-Minerals (PRESERVISION AREDS 2) CAPS, Take 1 capsule by  mouth in the morning and at bedtime., Disp: , Rfl:   Consent:   Informed Consent   Shared Decision Making/Informed Consent The risks (stroke, cardiac arrhythmias rarely resulting in the need for a temporary or permanent pacemaker, skin irritation or burns and complications associated with conscious sedation including aspiration, arrhythmia, respiratory failure and death), benefits (restoration of normal sinus rhythm) and alternatives of a direct current cardioversion were explained in detail to Mr. Attig and he agrees to proceed.     Disposition:   59-month follow-up sooner if needed  His questions and concerns were addressed to his satisfaction. He voices understanding of  the recommendations provided during this encounter.    Signed, [name], [facility]  A Division of [facility] [address], [place], [place] [address]  [place], [place] [address]

## 2024-06-27 NOTE — H&P (View-Only) (Signed)
 Cardiology Office Note:  .    ID:  Isaac Clark, DOB 02-16-1941, MRN [id number] PCP:  [name], MD  Former Cardiology Providers: None  [facility] Providers Cardiologist:  [name], DO , [facility] (established care 05/04/2021) Electrophysiologist:  None  Click to update primary MD,subspecialty MD or APP then REFRESH:1}    Chief Complaint  Patient presents with   Follow-up   Paroxysmal atrial flutter     History of Present Illness: Isaac Clark is a 83 y.o. Caucasian male whose past medical history and cardiovascular risk factors includes: Atrial flutter, hypertension, hyperlipidemia, erectile dysfunction, non-insulin-dependent diabetes mellitus type 2, former smoker, advanced age.   Patient has known history of atrial flutter which has been monitoring at home with his iWatch.  Initially his A-fib/flutter burden was up close to 40% with a titration up AV nodal blocking agents and antiarrhythmic overall burden improved.  He was last seen in the office in September 2024.  Patient presents today for regular follow-up.  He has been in A-fib for the last 3 weeks at least and his heart rate is consistently elevated greater than 127 bpm at times. He is currently on Xarelto for thromboembolic prophylaxis without interruption over the last 3 weeks, metoprolol , and flecainide .  Patient states that clinically he feels tired, fatigued, and shortness of breath with minimal activity such as climbing 2 steps and ambulating at home.  Patient is willing to be evaluated by EP for possible catheter directed ablation and consider cardioversion in the interim.  Unfortunately, recently had a mechanical fall in which he injured his upper lip when he hit the floor.  Patient states that his head did not hit the floor during this occurrence.  Review of Systems: .   Review of Systems  Constitutional: Positive for malaise/fatigue.  Cardiovascular:  Positive for dyspnea on exertion,  irregular heartbeat and palpitations. Negative for chest pain, claudication, leg swelling, near-syncope, orthopnea, paroxysmal nocturnal dyspnea and syncope.  Respiratory:  Positive for shortness of breath.   Hematologic/Lymphatic: Negative for bleeding problem.    Studies Reviewed:   EKG: 04/19/2021 provided by PCP: Atrial flutter, 86 bpm without underlying ischemia or injury pattern.  08/21/2023: NSR, 72bpm, RBBB, without underlying injury pattern.   EKG Interpretation Date/Time:  Thursday June 27 2024 15:27:33 EDT Ventricular Rate:  86 PR Interval:    QRS Duration:  152 QT Interval:  404 QTC Calculation: 483 R Axis:   -54  Text Interpretation: Atrial fibrillation Right bundle branch block Left anterior fascicular block Bifascicular block Septal infarct (cited on or before 11-Apr-2024) When compared with ECG of 11-Apr-2024 09:51, Atrial fibrillation has replaced Sinus rhythm Confirmed by Large Madonna [phone]) on 06/27/2024 3:37:48 PM  Echocardiogram: 05/17/2021: Left ventricle cavity is normal in size. Mild concentric hypertrophy of the left ventricle. Normal global wall motion. Normal LV systolic function with EF 68%. Normal diastolic filling pattern. Moderate (Grade II) mitral regurgitation. Mild tricuspid regurgitation. Estimated pulmonary artery systolic pressure 23 mmHg.  Stress Testing: Exercise tetrofosmin stress test  05/18/2021: Low risk.  RADIOLOGY: NA  Risk Assessment/Calculations:   Click Here to Calculate/Change CHADS2VASc Score The patient's CHADS2-VASc score is 5, indicating a 7.2% annual risk of stroke.  CHF History: No HTN History: Yes Diabetes History: Yes Stroke History: No Vascular Disease History: Yes  Labs:   External Labs: Collected: August 29, 2022 performed at PCPs office available in Care Everywhere. A1c 6.7 Total cholesterol 158, triglycerides 96, HDL 70, non-HDL 88, LDL 69  Sodium 136, potassium 4, chloride 100, bicarb 28, BUN 22,  creatinine 1.16. AST 23, ALT 20, alkaline phosphatase 76  Collected: April 2025 by PCP, provided by the patient. Hemoglobin 14.7, hematocrit 45.7%. TSH 1.3. Total cholesterol 163, triglycerides 124, HDL 77, calculated LDL 64. BUN 16, creatinine 1.05   Physical Exam:    Today's Vitals   06/27/24 1522 06/27/24 1547  BP: (!) 163/111 135/82  Pulse: 87 92  Resp: 16   SpO2: 98%   Weight: 202 lb 9.6 oz (91.9 kg)   Height: 5' 10 (1.778 m)    Body mass index is 29.07 kg/m. Wt Readings from Last 3 Encounters:  06/27/24 202 lb 9.6 oz (91.9 kg)  04/11/24 205 lb 3.2 oz (93.1 kg)  08/21/23 199 lb 3.2 oz (90.4 kg)    Physical Exam  Constitutional: No distress.  Age appropriate, hemodynamically stable.   HENT:  Dry blood upper lip  Neck: No JVD present.  Cardiovascular: Normal rate, S1 normal, S2 normal, intact distal pulses and normal pulses. An irregularly irregular rhythm present. Exam reveals no gallop, no S3 and no S4.  No murmur heard. Pulmonary/Chest: Effort normal and breath sounds normal. No stridor. He has no wheezes. He has no rales.  Abdominal: Soft. Bowel sounds are normal. He exhibits no distension. There is no abdominal tenderness.  Musculoskeletal:        General: No edema.     Cervical back: Neck supple.  Neurological: He is alert and oriented to person, place, and time. He has intact cranial nerves (2-12).  Skin: Skin is warm and moist.     Impression:   ICD-10-CM   1. Atrial flutter, chronic (HCC)  I48.92 EKG 12-Lead    CBC    Basic Metabolic Panel (BMET)    Ambulatory referral to Cardiac Electrophysiology    Basic Metabolic Panel (BMET)    CBC    2. Long term (current) use of anticoagulants  Z79.01     3. Long term current use of antiarrhythmic drug  Z79.899     4. Benign hypertension  I10     5. Mixed hyperlipidemia  E78.2        Recommendation(s):  Persistent atrial flutter (HCC) Rate control: Metoprolol  succinate 25 mg p.o. daily. Rhythm  control: Flecainide  50 mg p.o. twice daily. Thromboembolic prophylaxis: Xarelto 20 mg p.o. supper He is to monitor his atrial fibrillation/flutter burden with his iWatch. He has been in atrial fibrillation according to his iWatch for the last several weeks despite being on medical therapy.  Now he is symptomatic with feeling tired, fatigue, shortness of breath with minimal activity.   Patient is requesting referral to EP for consideration of catheter directed ablation.  Consult placed for Dr. Kennyth. In the interim patient is willing to undergo cardioversion to hopefully restore/maintain sinus rhythm given the fact that he is symptomatic with his persistent atrial flutter. In the past we did uptitrate flecainide  which she did not tolerate well. He is also refused to be evaluated for sleep apnea. Check CBC and BMP Risks, benefits, alternatives to direct-current cardioversion discussed  Long term (current) use of anticoagulants Currently on Xarelto. Had a recent mechanical fall leading to a lip injury; otherwise, does not endorse any other evidence of bleeding.  Long term current use of antiarrhythmic drug Currently on flecainide . Renal function and electrolytes are optimized Did not tolerate higher dose of flecainide  in the past  Benign hypertension Office blood pressures are not at goal. Home blood pressures are consistently <  130 mmHg. Reemphasized importance of low-salt diet.   Cardiology following peripherally, managed by primary care provider.  Mixed hyperlipidemia Currently on simvastatin 20 mg p.o. daily.   He denies myalgia or other side effects. Most recent lipids dated April 2025, independently reviewed as noted above.  LDL is 65 mg/dL.  Cardiology is following peripherally.  Orders Placed:  Orders Placed This Encounter  Procedures   CBC    Standing Status:   Future    Number of Occurrences:   1    Expected Date:   06/27/2024    Expiration Date:   06/27/2025   Basic  Metabolic Panel (BMET)    Standing Status:   Future    Number of Occurrences:   1    Expected Date:   06/27/2024    Expiration Date:   06/27/2025   Ambulatory referral to Cardiac Electrophysiology    Referral Priority:   Urgent    Referral Type:   Consultation    Referral Reason:   Specialty Services Required    Referred to Provider:   [name], MD    Requested Specialty:   Cardiology    Number of Visits Requested:   1   EKG 12-Lead   Final Medication List:    No orders of the defined types were placed in this encounter.   There are no discontinued medications.    Current Outpatient Medications:    albuterol (PROVENTIL HFA;VENTOLIN HFA) 108 (90 Base) MCG/ACT inhaler, Inhale 1-2 puffs into the lungs every 6 (six) hours as needed for wheezing or shortness of breath., Disp: , Rfl:    Cholecalciferol (VITAMIN D PO), Take 1 tablet by mouth daily., Disp: , Rfl:    CINNAMON PO, Take 250 mg by mouth daily., Disp: , Rfl:    Cyanocobalamin (VITAMIN B 12 PO), Take 1 tablet by mouth daily., Disp: , Rfl:    flecainide  (TAMBOCOR ) 50 MG tablet, Take 1 tablet (50 mg total) by mouth 2 (two) times daily., Disp: 180 tablet, Rfl: 1   levothyroxine (SYNTHROID, LEVOTHROID) 100 MCG tablet, Take 100 mcg by mouth daily before breakfast., Disp: , Rfl: 1   lisinopril -hydrochlorothiazide (ZESTORETIC) 10-12.5 MG tablet, Take 1 tablet by mouth daily., Disp: , Rfl:    metFORMIN (GLUCOPHAGE-XR) 750 MG 24 hr tablet, Take 750 mg by mouth daily., Disp: , Rfl: 0   metoprolol  succinate (TOPROL -XL) 25 MG 24 hr tablet, TAKE 1 TABLET BY MOUTH EVERY MORNING, Disp: 90 tablet, Rfl: 2   sildenafil (VIAGRA) 50 MG tablet, Take 60 mg by mouth daily as needed for erectile dysfunction., Disp: , Rfl:    simvastatin (ZOCOR) 20 MG tablet, Take 20 mg by mouth daily., Disp: , Rfl: 3   XARELTO 20 MG TABS tablet, Take 20 mg by mouth daily., Disp: , Rfl:    Multiple Vitamins-Minerals (PRESERVISION AREDS 2) CAPS, Take 1 capsule by  mouth in the morning and at bedtime., Disp: , Rfl:   Consent:   Informed Consent   Shared Decision Making/Informed Consent The risks (stroke, cardiac arrhythmias rarely resulting in the need for a temporary or permanent pacemaker, skin irritation or burns and complications associated with conscious sedation including aspiration, arrhythmia, respiratory failure and death), benefits (restoration of normal sinus rhythm) and alternatives of a direct current cardioversion were explained in detail to Isaac Clark and he agrees to proceed.     Disposition:   59-month follow-up sooner if needed  His questions and concerns were addressed to his satisfaction. He voices understanding of  the recommendations provided during this encounter.    Signed, [name], [facility]  A Division of [facility] [address], [place], [place] [address]  [place], [place] [address]

## 2024-06-28 ENCOUNTER — Ambulatory Visit: Payer: Self-pay | Admitting: Cardiology

## 2024-06-28 LAB — CBC
Hematocrit: 45.3 % (ref 37.5–51.0)
Hemoglobin: 14.5 g/dL (ref 13.0–17.7)
MCH: 29.2 pg (ref 26.6–33.0)
MCHC: 32 g/dL (ref 31.5–35.7)
MCV: 91 fL (ref 79–97)
Platelets: 211 x10E3/uL (ref 150–450)
RBC: 4.97 x10E6/uL (ref 4.14–5.80)
RDW: 13.1 % (ref 11.6–15.4)
WBC: 5.8 x10E3/uL (ref 3.4–10.8)

## 2024-06-28 LAB — BASIC METABOLIC PANEL WITH GFR
BUN/Creatinine Ratio: 13 (ref 10–24)
BUN: 14 mg/dL (ref 8–27)
CO2: 21 mmol/L (ref 20–29)
Calcium: 9.6 mg/dL (ref 8.6–10.2)
Chloride: 94 mmol/L — AB (ref 96–106)
Creatinine, Ser: 1.05 mg/dL (ref 0.76–1.27)
Glucose: 125 mg/dL — AB (ref 70–99)
Potassium: 3.8 mmol/L (ref 3.5–5.2)
Sodium: 137 mmol/L (ref 134–144)
eGFR: 72 mL/min/1.73 (ref >59)

## 2024-06-28 NOTE — Progress Notes (Signed)
 Pt called for pre procedure instructions. Arrival time 0730 NPO after midnight explained Instructed to take am meds with sip of water and confirmed blood thinner consistency, xarelto. Instructed pt need for ride home tomorrow and have responsible adult with them for 24 hrs post procedure.

## 2024-06-30 ENCOUNTER — Encounter: Payer: Self-pay | Admitting: Cardiology

## 2024-07-01 ENCOUNTER — Ambulatory Visit (HOSPITAL_COMMUNITY)
Admission: RE | Admit: 2024-07-01 | Discharge: 2024-07-01 | Disposition: A | Discharge status: Home / Self Care | Attending: Cardiology | Admitting: Cardiology

## 2024-07-01 ENCOUNTER — Ambulatory Visit (HOSPITAL_COMMUNITY): Admitting: Anesthesiology

## 2024-07-01 ENCOUNTER — Other Ambulatory Visit: Payer: Self-pay

## 2024-07-01 ENCOUNTER — Encounter (HOSPITAL_COMMUNITY)
Admission: RE | Disposition: A | Discharge status: Home / Self Care | Payer: Self-pay | Source: Home / Self Care | Attending: Cardiology

## 2024-07-01 ENCOUNTER — Encounter (HOSPITAL_COMMUNITY): Payer: Self-pay | Admitting: Cardiology

## 2024-07-01 DIAGNOSIS — I10 Essential (primary) hypertension: Secondary | ICD-10-CM | POA: Diagnosis not present

## 2024-07-01 DIAGNOSIS — N529 Male erectile dysfunction, unspecified: Secondary | ICD-10-CM | POA: Diagnosis not present

## 2024-07-01 DIAGNOSIS — E782 Mixed hyperlipidemia: Secondary | ICD-10-CM | POA: Insufficient documentation

## 2024-07-01 DIAGNOSIS — I4819 Other persistent atrial fibrillation: Secondary | ICD-10-CM | POA: Diagnosis not present

## 2024-07-01 DIAGNOSIS — E119 Type 2 diabetes mellitus without complications: Secondary | ICD-10-CM | POA: Diagnosis not present

## 2024-07-01 DIAGNOSIS — Z7901 Long term (current) use of anticoagulants: Secondary | ICD-10-CM | POA: Diagnosis not present

## 2024-07-01 DIAGNOSIS — I483 Typical atrial flutter: Secondary | ICD-10-CM

## 2024-07-01 DIAGNOSIS — Z87891 Personal history of nicotine dependence: Secondary | ICD-10-CM | POA: Diagnosis not present

## 2024-07-01 DIAGNOSIS — Z79899 Other long term (current) drug therapy: Secondary | ICD-10-CM | POA: Insufficient documentation

## 2024-07-01 DIAGNOSIS — Z7984 Long term (current) use of oral hypoglycemic drugs: Secondary | ICD-10-CM | POA: Insufficient documentation

## 2024-07-01 DIAGNOSIS — I4891 Unspecified atrial fibrillation: Secondary | ICD-10-CM | POA: Diagnosis not present

## 2024-07-01 HISTORY — PX: CARDIOVERSION: EP1203

## 2024-07-01 SURGERY — CARDIOVERSION (CATH LAB)
Anesthesia: General

## 2024-07-01 MED ORDER — SODIUM CHLORIDE 0.9 % IV SOLN
INTRAVENOUS | Status: DC
Start: 2024-07-01 — End: 2024-07-01

## 2024-07-01 MED ORDER — PROPOFOL 10 MG/ML IV BOLUS
INTRAVENOUS | Status: DC | PRN
  Administered 2024-07-01: 70 mg via INTRAVENOUS

## 2024-07-01 MED ORDER — LISINOPRIL 10 MG PO TABS: 10.0000 mg | ORAL_TABLET | Freq: Every evening | ORAL | 0 refills | Status: DC

## 2024-07-01 SURGICAL SUPPLY — 1 items: PAD DEFIB RADIO PHYSIO CONN (PAD) ×1 IMPLANT

## 2024-07-01 NOTE — Interval H&P Note (Signed)
 History and Physical Interval Note:  07/01/2024 7:56 AM  Zachary LULLA Saunders  has presented today for surgery, with the diagnosis of afib.  The various methods of treatment have been discussed with the patient and family. After consideration of risks, benefits and other options for treatment, the patient has consented to  Procedure(s): CARDIOVERSION (N/A) as a surgical intervention.  The patient's history has been reviewed, patient examined, no change in status, stable for surgery.  I have reviewed the patient's chart and labs.  Questions were answered to the patient's satisfaction.    Informed Consent   Shared Decision Making/Informed Consent The risks (stroke, cardiac arrhythmias rarely resulting in the need for a temporary or permanent pacemaker, skin irritation or burns and complications associated with conscious sedation including aspiration, arrhythmia, respiratory failure and death), benefits (restoration of normal sinus rhythm) and alternatives of a direct current cardioversion were explained in detail to Mr. Bugaj and he agrees to proceed.      No missed doses of Xarelto over the last 3 weeks, per patient.   Contact Person: Wife.   [name], [facility]  A Division of [facility] [address], [place], [place] [address]  [place], [place] [address]

## 2024-07-01 NOTE — Transfer of Care (Signed)
 Immediate Anesthesia Transfer of Care Note  Patient: Isaac Clark  Procedure(s) Performed: CARDIOVERSION  Patient Location: PACU and Cath Lab  Anesthesia Type:MAC  Level of Consciousness: drowsy  Airway & Oxygen Therapy: Patient Spontanous Breathing and Patient connected to nasal cannula oxygen  Post-op Assessment: Report given to RN and Post -op Vital signs reviewed and stable  Post vital signs: Reviewed and stable 102/68 Last Vitals:  Vitals Value Taken Time  BP 102/68   Temp    Pulse 50   Resp 16   SpO2 96     Last Pain:  Vitals:   07/01/24 0759  TempSrc:   PainSc: 0-No pain         Complications: No notable events documented.

## 2024-07-01 NOTE — CV Procedure (Signed)
   DIRECT CURRENT CARDIOVERSION  NAME:  Isaac Clark    MRN: [id number] DOB:  Mar 05, 1941    ADMIT DATE: 07/01/2024  Indication:  Symptomatic atrial flutter/fibrillation (persistent)  Procedure Note:  The patient signed informed consent.  He has been on therapeutic anticoagulation with Xarelto greater than or equal to 3 weeks.  Anesthesia was administered by Dr. Epifanio.  Adequate airway was maintained throughout and vital followed per protocol.  He was cardioverted x 1 with 200J of biphasic synchronized energy.  Post procedure rhythm was sinus bradycardia. There were no apparent complications.  The patient had normal neuro status and respiratory status post procedure with vitals stable as recorded elsewhere.    Follow up:  Continue on current medical therapy and follow up with cardiology as scheduled. Discontinue lisinopril  / hydrochlorothiazide.  Will send in Lisinopril  10 mg po qpm w/ holding parameters  Advised to take Toprol  XL in the morning and continue current dose of flecainide .  Tried calling the wife twice unreachable goes to voicemail.   [name], [facility]  A Division of [facility] [address] [place], [place] [address]  [place], [place] [address] 9:27 AM

## 2024-07-01 NOTE — Anesthesia Preprocedure Evaluation (Signed)
 Anesthesia Evaluation  Patient identified by MRN, date of birth, ID band Patient awake    Reviewed: Allergy & Precautions, NPO status , Patient's Chart, lab work & pertinent test results  Airway Mallampati: II  TM Distance: >3 FB Neck ROM: Full    Dental  (+) Dental Advisory Given   Pulmonary asthma , former smoker   breath sounds clear to auscultation       Cardiovascular hypertension, Pt. on medications and Pt. on home beta blockers + dysrhythmias Atrial Fibrillation  Rhythm:Irregular Rate:Normal     Neuro/Psych negative neurological ROS     GI/Hepatic negative GI ROS, Neg liver ROS,,,  Endo/Other  diabetes, Type 2    Renal/GU negative Renal ROS     Musculoskeletal  (+) Arthritis ,    Abdominal   Peds  Hematology negative hematology ROS (+)   Anesthesia Other Findings   Reproductive/Obstetrics                              Anesthesia Physical Anesthesia Plan  ASA: 3  Anesthesia Plan: General   Post-op Pain Management:    Induction: Intravenous  PONV Risk Score and Plan: 2  Airway Management Planned: Mask, Nasal Cannula and Natural Airway  Additional Equipment:   Intra-op Plan:   Post-operative Plan:   Informed Consent: I have reviewed the patients History and Physical, chart, labs and discussed the procedure including the risks, benefits and alternatives for the proposed anesthesia with the patient or authorized representative who has indicated his/her understanding and acceptance.       Plan Discussed with:   Anesthesia Plan Comments:         Anesthesia Quick Evaluation

## 2024-07-02 NOTE — Anesthesia Postprocedure Evaluation (Signed)
 Anesthesia Post Note  Patient: Isaac Clark  Procedure(s) Performed: CARDIOVERSION     Patient location during evaluation: PACU Anesthesia Type: General Level of consciousness: awake and alert Pain management: pain level controlled Vital Signs Assessment: post-procedure vital signs reviewed and stable Respiratory status: spontaneous breathing, nonlabored ventilation, respiratory function stable and patient connected to nasal cannula oxygen Cardiovascular status: blood pressure returned to baseline and stable Postop Assessment: no apparent nausea or vomiting Anesthetic complications: no   No notable events documented.  Last Vitals:  Vitals:   07/01/24 0936 07/01/24 0947  BP: 97/69 102/72  Pulse: (!) 53 (!) 56  Resp: 10 16  Temp:    SpO2: 97% 96%    Last Pain:  Vitals:   07/01/24 0929  TempSrc: Temporal  PainSc: 0-No pain                 Epifanio Lamar BRAVO

## 2024-07-15 ENCOUNTER — Encounter (INDEPENDENT_AMBULATORY_CARE_PROVIDER_SITE_OTHER): Admitting: Ophthalmology

## 2024-07-15 DIAGNOSIS — I10 Essential (primary) hypertension: Secondary | ICD-10-CM | POA: Diagnosis not present

## 2024-07-15 DIAGNOSIS — H353122 Nonexudative age-related macular degeneration, left eye, intermediate dry stage: Secondary | ICD-10-CM | POA: Diagnosis not present

## 2024-07-15 DIAGNOSIS — H43813 Vitreous degeneration, bilateral: Secondary | ICD-10-CM

## 2024-07-15 DIAGNOSIS — H35033 Hypertensive retinopathy, bilateral: Secondary | ICD-10-CM | POA: Diagnosis not present

## 2024-07-15 DIAGNOSIS — H353211 Exudative age-related macular degeneration, right eye, with active choroidal neovascularization: Secondary | ICD-10-CM | POA: Diagnosis not present

## 2024-07-22 NOTE — Progress Notes (Unsigned)
 Electrophysiology Office Note:   Date:  07/24/2024  ID:  Isaac Clark, DOB September 06, 1941, MRN [id number]  Primary Cardiologist: [name], DO Electrophysiologist: [name], MD      History of Present Illness:   Isaac Clark is a 83 y.o. male with h/o hypertension, hyperlipidemia, erectile dysfunction, non-insulin-dependent diabetes mellitus type 2, former smoker who is being seen today for evaluation for atrial flutter ablation at the request of Dr. Large.   Discussed the use of AI scribe software for clinical note transcription with the patient, who gave verbal consent to proceed.  History of Present Illness Isaac Clark is an 83 year old male with atrial fibrillation who presents for evaluation of his condition and management of symptoms. He is accompanied by his wife, Isaac Clark. He was referred by Dr. Large for evaluation of atrial fibrillation and discussion of blood thinner management.  He has a history of atrial fibrillation and is currently on Xarelto. He describes a worsening bruise on his arm that began as a suspected bruise but started bleeding again, prompting him to wrap it. No recent falls or specific incidents could have caused the bruise, although he recalls a fall about three to four weeks ago when he felt lightheaded and weak, leading to a fall where he went face down. He mentions a previous hematoma in his hand from a minor impact, which required emergency room attention. He also notes significant bleeding even with minor cuts, a condition present even before starting blood thinners.  He describes feeling a significant difference after a recent cardioversion, noting improved well-being for a week before symptoms returned. He experiences fatigue, especially in the mornings, and reports his heart racing. His smart watch indicates he has been in atrial fibrillation 100% of the time in recent weeks. He is currently taking metoprolol  and flecainide  but reports that  flecainide  is not effective in maintaining normal rhythm. His heart rate, previously in the fifties at rest, has increased to the eighties and nineties, occasionally exceeding 100 during activities like mowing the lawn, which leaves him feeling exhausted.   Review of systems complete and found to be negative unless listed in HPI.   EP Information / Studies Reviewed:    EKG is ordered today. Personal review as below.  EKG Interpretation Date/Time:  Tuesday July 23 2024 09:10:44 EDT Ventricular Rate:  88 PR Interval:    QRS Duration:  146 QT Interval:  410 QTC Calculation: 496 R Axis:   -57  Text Interpretation: Atrial fibrillation Right bundle branch block Left anterior fascicular block Bifascicular block Septal infarct (cited on or before 11-Apr-2024) When compared with ECG of 01-Jul-2024 09:22, AF has replaced sinus rhythm Confirmed by Kitty Fonda [phone]) on 07/23/2024 9:16:35 AM   EKG 06/27/24: AF   Exercise stress test  05/18/2021: Normal ECG stress. The patient exercised for 5 minutes and 44 seconds of a Bruce protocol, achieving approximately 7.05 METs.  achieved 112% of MPHR. Accelerate heart rate response. Hypertensive BP response. Peak BP 216/120 mm Hg. Dyspnea and recovery chest pain noted during exercise stress testing. Rare PVC.  Myocardial perfusion is normal. Overall LV systolic function is normal without regional wall motion abnormalities. Stress LV EF: 69%.  No previous exam available for comparison. Low risk.   Echo 05/17/21:  Echocardiogram 05/17/2021:  Left ventricle cavity is normal in size. Mild concentric hypertrophy of  the left ventricle. Normal global wall motion. Normal LV systolic function  with EF 68%. Normal diastolic filling pattern.  Moderate (Grade  II) mitral regurgitation.  Mild tricuspid regurgitation. Estimated pulmonary artery systolic pressure  23 mmHg.   Risk Assessment/Calculations:    CHA2DS2-VASc Score = 5   This indicates a 7.2%  annual risk of stroke. The patient's score is based upon: CHF History: 0 HTN History: 1 Diabetes History: 1 Stroke History: 0 Vascular Disease History: 1 Age Score: 2 Gender Score: 0          Physical Exam:   VS:  BP (!) 187/114   Pulse 88   Ht 5' 10 (1.778 m)   Wt 198 lb (89.8 kg)   SpO2 97%   BMI 28.41 kg/m    Wt Readings from Last 3 Encounters:  07/23/24 198 lb (89.8 kg)  06/27/24 202 lb 9.6 oz (91.9 kg)  04/11/24 205 lb 3.2 oz (93.1 kg)     GEN: Well nourished, well developed in no acute distress NECK: No JVD CARDIAC: Normal rate, regular rhythm RESPIRATORY:  Clear to auscultation without rales, wheezing or rhonchi  ABDOMEN: Soft, non-distended EXTREMITIES:  No edema; No deformity   ASSESSMENT AND PLAN:    #Persistent atrial fibrillation/flutter: Symptomatic.  Reports marked improvement in symptoms after cardioversion during period of sinus rhythm.  Unfortunately, sinus rhythm was short-lived. #High risk medication use: Flecainide . No PR d/t AF and QRS . -Discussed treatment options today for AF including antiarrhythmic drug therapy and ablation. Discussed risks, recovery and likelihood of success with each treatment strategy. Risk, benefits, and alternatives to EP study and ablation for afib were discussed. These risks include but are not limited to stroke, bleeding, vascular damage, tamponade, perforation, damage to the esophagus, lungs, phrenic nerve and other structures, pulmonary vein stenosis, worsening renal function, coronary vasospasm and death.  Discussed potential need for repeat ablation procedures and antiarrhythmic drugs after an initial ablation. The patient understands these risk and wishes to proceed.  We will therefore proceed with catheter ablation at the next available time.  Carto, ICE, anesthesia are requested for the procedure.   -We will obtain CT PV and LAA protocol prior to procedure.  -Increase metoprolol  XL to 25mg  BID.    #Hypertension -Above goal today.  Recommend checking blood pressures 1-2 times per week at home and recording the values.  Recommend bringing these recordings to the primary care physician.  #Hypercoagulable state due to atrial fibrillation:  I have seen Isaac Clark in the office today who is being considered for a Watchman left atrial appendage closure device. I believe they will benefit from this procedure given their history of atrial fibrillation, CHA2DS2-VASc score of 5 and unadjusted ischemic stroke rate of 7.2% per year. Unfortunately, the patient is not felt to be a long term anticoagulation candidate secondary to falls, syncope, spontaneous hematomas and bleeding without a reversible cause. The patient's chart has been reviewed and I feel that they would be a candidate for short term oral anticoagulation after Watchman implant.   It is my belief that after undergoing a LAA closure procedure, Isaac Clark will not need long term anticoagulation which eliminates anticoagulation side effects and major bleeding risk.   Procedural risks for the Watchman implant have been reviewed with the patient including a 0.5% risk of stroke, <1% risk of perforation and <1% risk of device embolization. Other risks include bleeding, vascular damage, tamponade, worsening renal function, and death. The patient understands these risk and wishes to proceed.    The published clinical data on the safety and effectiveness of WATCHMAN include but are not limited to  the following: - Rosabel DR, Jess BEARD, Sick P et al. for the PROTECT AF Investigators. Percutaneous closure of the left atrial appendage versus warfarin therapy for prevention of stroke in patients with atrial fibrillation: a randomised non-inferiority trial. Lancet 2009; 374: 534-42. GLENWOOD Jess BEARD, Doshi SK, Jonita VEAR Rosabel D et al. on behalf of the PROTECT AF Investigators. Percutaneous Left Atrial Appendage Closure for Stroke Prophylaxis in  Patients With Atrial Fibrillation 2.3-Year Follow-up of the PROTECT AF (Watchman Left Atrial Appendage System for Embolic Protection in Patients With Atrial Fibrillation) Trial. Circulation 2013; 127:720-729. - Alli O, Doshi S,  Kar S, Reddy VY, Sievert H et al. Quality of Life Assessment in the Randomized PROTECT AF (Percutaneous Closure of the Left Atrial Appendage Versus Warfarin Therapy for Prevention of Stroke in Patients With Atrial Fibrillation) Trial of Patients at Risk for Stroke With Nonvalvular Atrial Fibrillation. J Am Coll Cardiol 2013; 61:1790-8. GLENWOOD Rosabel DR, Archer RAMAN, Price M, Whisenant B, Sievert H, Doshi S, Huber K, Reddy V. Prospective randomized evaluation of the Watchman left atrial appendage Device in patients with atrial fibrillation versus long-term warfarin therapy; the PREVAIL trial. Journal of the [employer] of Cardiology, Vol. 4, No. 1, 2014, 1-11. - Kar S, Doshi SK, Sadhu A, Horton R, Osorio J et al. Primary outcome evaluation of a next-generation left atrial appendage closure device: results from the PINNACLE FLX trial. Circulation 2021;143(18)1754-1762.   HAS-BLED score 3 Hypertension Yes  Abnormal renal and liver function (Dialysis, transplant, Cr >2.26 mg/dL /Cirrhosis or Bilirubin >2x Normal or AST/ALT/AP >3x Normal) No  Stroke No  Bleeding Yes  Labile INR (Unstable/high INR) No  Elderly (>65) Yes  Drugs or alcohol (>= 8 drinks/week, anti-plt or NSAID) No   CHA2DS2-VASc Score = 5  The patient's score is based upon: CHF History: 0 HTN History: 1 Diabetes History: 1 Stroke History: 0 Vascular Disease History: 1 Age Score: 2 Gender Score: 0      We will plan for Watchman implant to be done concomitantly at time of AF ablation utilizing ICE only.  He will continue Xarelto until 45 days after watchman implant.  Follow up with Dr. Kennyth 3 months after ablation/Watchman.   Signed, Fonda Kennyth, MD

## 2024-07-23 ENCOUNTER — Encounter: Payer: Self-pay | Admitting: Cardiology

## 2024-07-23 ENCOUNTER — Ambulatory Visit: Attending: Cardiology | Admitting: Cardiology

## 2024-07-23 VITALS — BP 187/114 | HR 88 | Ht 70.0 in | Wt 198.0 lb

## 2024-07-23 DIAGNOSIS — D6869 Other thrombophilia: Secondary | ICD-10-CM

## 2024-07-23 DIAGNOSIS — I4819 Other persistent atrial fibrillation: Secondary | ICD-10-CM

## 2024-07-23 DIAGNOSIS — I483 Typical atrial flutter: Secondary | ICD-10-CM | POA: Diagnosis not present

## 2024-07-23 DIAGNOSIS — Z79899 Other long term (current) drug therapy: Secondary | ICD-10-CM

## 2024-07-23 DIAGNOSIS — I10 Essential (primary) hypertension: Secondary | ICD-10-CM

## 2024-07-23 DIAGNOSIS — I4892 Unspecified atrial flutter: Secondary | ICD-10-CM | POA: Diagnosis not present

## 2024-07-23 MED ORDER — METOPROLOL SUCCINATE ER 25 MG PO TB24
25.0000 mg | ORAL_TABLET | Freq: Two times a day (BID) | ORAL | 3 refills | Status: AC
Start: 2024-07-23 — End: ?

## 2024-07-23 NOTE — Patient Instructions (Signed)
 Medication Instructions:  Your physician has recommended you make the following change in your medication:  1) STOP taking flecainide   2) INCREASE Toprol  XL (metoprolol  succinate) to 25 mg twice daily   *If you need a refill on your cardiac medications before your next appointment, please call your pharmacy*  Testing/Procedures: Cardiac CT Your physician has requested that you have cardiac CT. Cardiac computed tomography (CT) is a painless test that uses an x-ray machine to take clear, detailed pictures of your heart. For further information please visit [email or web address] Please follow instruction sheet as given. You will be called to schedule this test.   Ablation  Your physician has recommended that you have an ablation. Catheter ablation is a medical procedure used to treat some cardiac arrhythmias (irregular heartbeats). During catheter ablation, a long, thin, flexible tube is put into a blood vessel in your groin (upper thigh), or neck. This tube is called an ablation catheter. It is then guided to your heart through the blood vessel. Radio frequency waves destroy small areas of heart tissue where abnormal heartbeats may cause an arrhythmia to start.   Watchman  Your physician has requested that you have Left atrial appendage (LAA) closure device implantation is a procedure to put a small device in the LAA of the heart. The LAA is a small sac in the wall of the heart's left upper chamber. Blood clots can form in this area. The device, Watchman closes the LAA to help prevent a blood clot and stroke.   You will be contacted by Nurse Navigator, Rockie Redman to schedule your pre-procedure visit and procedure date. If you have any questions she can be reached at [phone].   Follow-Up: At [facility], you and your health needs are our priority.  As part of our continuing mission to provide you with exceptional heart care, our providers are all part of one team.  This team includes  your primary Cardiologist (physician) and Advanced Practice Providers or APPs (Physician Assistants and Nurse Practitioners) who all work together to provide you with the care you need, when you need it.

## 2024-07-24 LAB — BASIC METABOLIC PANEL WITH GFR
BUN/Creatinine Ratio: 16 (ref 10–24)
BUN: 17 mg/dL (ref 8–27)
CO2: 23 mmol/L (ref 20–29)
Calcium: 8.9 mg/dL (ref 8.6–10.2)
Chloride: 101 mmol/L (ref 96–106)
Creatinine, Ser: 1.09 mg/dL (ref 0.76–1.27)
Glucose: 120 mg/dL — AB (ref 70–99)
Potassium: 4.5 mmol/L (ref 3.5–5.2)
Sodium: 141 mmol/L (ref 134–144)
eGFR: 69 mL/min/1.73 (ref >59)

## 2024-07-24 LAB — HEMOGLOBIN AND HEMATOCRIT, BLOOD
Hematocrit: 42.5 % (ref 37.5–51.0)
Hemoglobin: 13.5 g/dL (ref 13.0–17.7)

## 2024-07-25 ENCOUNTER — Ambulatory Visit: Payer: Self-pay | Admitting: Cardiology

## 2024-08-06 ENCOUNTER — Telehealth: Payer: Self-pay

## 2024-08-06 NOTE — Telephone Encounter (Signed)
 Scheduled the patient for cCT 08/12/2024 and instructions sent. He wishes to proceed with concomitant ablation/LAAO on 11/11/2024 pending CT results. He was grateful for call and agreed with plan.

## 2024-08-12 ENCOUNTER — Ambulatory Visit (HOSPITAL_COMMUNITY)
Admission: RE | Admit: 2024-08-12 | Discharge: 2024-08-12 | Disposition: A | Discharge status: Home / Self Care | Source: Ambulatory Visit | Attending: Internal Medicine | Admitting: Internal Medicine

## 2024-08-12 DIAGNOSIS — I4819 Other persistent atrial fibrillation: Secondary | ICD-10-CM | POA: Insufficient documentation

## 2024-08-12 MED ORDER — IOHEXOL 350 MG/ML SOLN
80.0000 mL | Freq: Once | INTRAVENOUS | Status: AC | PRN
Start: 2024-08-12 — End: 2024-08-12
  Administered 2024-08-12: 80 mL via INTRAVENOUS

## 2024-09-02 ENCOUNTER — Encounter (INDEPENDENT_AMBULATORY_CARE_PROVIDER_SITE_OTHER): Admitting: Ophthalmology

## 2024-09-02 DIAGNOSIS — I10 Essential (primary) hypertension: Secondary | ICD-10-CM | POA: Diagnosis not present

## 2024-09-02 DIAGNOSIS — H35033 Hypertensive retinopathy, bilateral: Secondary | ICD-10-CM | POA: Diagnosis not present

## 2024-09-02 DIAGNOSIS — H43813 Vitreous degeneration, bilateral: Secondary | ICD-10-CM

## 2024-09-02 DIAGNOSIS — H353211 Exudative age-related macular degeneration, right eye, with active choroidal neovascularization: Secondary | ICD-10-CM

## 2024-09-02 DIAGNOSIS — H353122 Nonexudative age-related macular degeneration, left eye, intermediate dry stage: Secondary | ICD-10-CM

## 2024-09-05 ENCOUNTER — Telehealth: Payer: Self-pay

## 2024-09-05 NOTE — Telephone Encounter (Signed)
 Isaac Clark: Windsock morphology. Distal filling defect noted. Max 27/ AVG 25.5/ Depth 20 Likely use a 31mm device Inf/Mid TSP RAO 30 CAU 30

## 2024-09-06 ENCOUNTER — Telehealth: Payer: Self-pay

## 2024-09-06 DIAGNOSIS — Q2112 Patent foramen ovale: Secondary | ICD-10-CM

## 2024-09-06 NOTE — Telephone Encounter (Signed)
-----   Message from [name] sent at 09/05/2024  5:27 PM EDT ----- Yes, let's order a regular echo with bubble study. LAA anatomy appears amenable to closure with Watchman.   Josh ----- Message ----- From: Josue Lamarr LABOR, RN Sent: 09/05/2024   3:44 PM EDT To: [name], MD  Hey Dr. Kitty,  I just got the TruPlan for him and routed to you. Do you want to schedule a bubble study? He is tentatively scheduled for concomitant with ICE pending your review of CT. Thank you! Rockie

## 2024-09-06 NOTE — Telephone Encounter (Signed)
 Per Dr. Kennyth, called to inform the patient his anatomy is suitable for LAAO. Will schedule bubble study to assess possible PFO prior to tentative concomitant ablation/Watchman procedure.  Left message to call back.

## 2024-09-09 NOTE — Addendum Note (Signed)
 Addended by: Eyob Godlewski A on: 09/09/2024 10:24 AM   Modules accepted: Orders

## 2024-09-09 NOTE — Telephone Encounter (Signed)
 Spoke with the patient at length. While he understands his anatomy is suitable for LAAO, he also understands a bubble study is needed to confirm/assess potential PFO seen on CT. Scheduled bubble study 10/14. He was grateful for call and agreed with plan.

## 2024-09-16 ENCOUNTER — Ambulatory Visit: Payer: Self-pay | Admitting: Cardiology

## 2024-09-16 ENCOUNTER — Telehealth: Payer: Self-pay | Admitting: Cardiology

## 2024-09-16 NOTE — Telephone Encounter (Signed)
 Spoke with pt regarding his elevated blood pressure. Pt stated he has not taken his blood pressure in months but went to the eye doctor today and found that it was elevated. Pt stated he took two readings this afternoon 1 hour apart. The readings are as follows: 160/109, 160/108. Pt took the readings while resting on the couch. Pt is taking all meds as prescribed. Pt denies any symptoms. Pt also denies eating any processed or salty foods. Pt was give ED precautions should he become symptomatic. Pt was told Dr. Michele would be notified. Pt verbalized understanding. All questions if any were answered.

## 2024-09-16 NOTE — Telephone Encounter (Signed)
 Pt c/o BP issue: STAT if pt c/o blurred vision, one-sided weakness or slurred speech.  STAT if BP is GREATER than 180/120 TODAY.  STAT if BP is LESS than 90/60 and SYMPTOMATIC TODAY  1. What is your BP concern? Elevated  2. Have you taken any BP medication today? yes  3. What are your last 5 BP readings?160/108, 169/109  4. Are you having any other symptoms (ex. Dizziness, headache, blurred vision, passed out)? no

## 2024-09-17 ENCOUNTER — Ambulatory Visit (HOSPITAL_COMMUNITY)
Admission: RE | Admit: 2024-09-17 | Discharge: 2024-09-17 | Disposition: A | Discharge status: Home / Self Care | Source: Ambulatory Visit | Attending: Internal Medicine | Admitting: Internal Medicine

## 2024-09-17 ENCOUNTER — Other Ambulatory Visit: Payer: Self-pay

## 2024-09-17 DIAGNOSIS — I483 Typical atrial flutter: Secondary | ICD-10-CM

## 2024-09-17 DIAGNOSIS — Q2112 Patent foramen ovale: Secondary | ICD-10-CM | POA: Diagnosis not present

## 2024-09-17 DIAGNOSIS — I4819 Other persistent atrial fibrillation: Secondary | ICD-10-CM

## 2024-09-17 LAB — ECHOCARDIOGRAM COMPLETE BUBBLE STUDY
Area-P 1/2: 4.17 cm2
MV M vel: 5.58 m/s
MV Peak grad: 124.5 mmHg
Radius: 0.5 cm
S' Lateral: 2.5 cm

## 2024-09-17 MED ORDER — AMLODIPINE BESYLATE 5 MG PO TABS: 5.0000 mg | ORAL_TABLET | Freq: Every day | ORAL | 0 refills | Status: AC

## 2024-09-17 NOTE — Telephone Encounter (Signed)
Patient is returning the call. Please advise

## 2024-09-17 NOTE — Telephone Encounter (Signed)
 Left message to call back.

## 2024-09-17 NOTE — Telephone Encounter (Signed)
 Please note last progress noted his blood pressures are currently being managed by PCP, will defer long-term management to them to keep continuity of care preserved.  However, in the interim can start amlodipine 5 mg p.o. every afternoon until he has a chance to follow-up with PCP.  Recommend a goal SBP of 130 mmHg  Dr. Michele

## 2024-09-17 NOTE — Telephone Encounter (Signed)
 Called pt advised of MD recommendation:  Please note last progress noted his blood pressures are currently being managed by PCP, will defer long-term management to them to keep continuity of care preserved.   However, in the interim can start amlodipine 5 mg p.o. every afternoon until he has a chance to follow-up with PCP.   Recommend a goal SBP of 130 mmHg   Dr. Michele      Pt expresses understanding no questions or concerns.

## 2024-09-18 ENCOUNTER — Ambulatory Visit: Payer: Self-pay | Admitting: Cardiology

## 2024-09-20 DIAGNOSIS — I10 Essential (primary) hypertension: Secondary | ICD-10-CM | POA: Diagnosis not present

## 2024-09-26 ENCOUNTER — Other Ambulatory Visit: Payer: Self-pay | Admitting: Cardiology

## 2024-09-30 ENCOUNTER — Telehealth: Payer: Self-pay

## 2024-09-30 NOTE — Telephone Encounter (Signed)
 Left voicemail for patient to return call. Upon return call will arrange pre-PVI/LAAO appt with EP APP.

## 2024-10-01 NOTE — Telephone Encounter (Addendum)
 Spoke with patient. Arranged pre-watchman/ablation OV with Charlies Arthur PA-C 11/25.

## 2024-10-21 ENCOUNTER — Encounter (INDEPENDENT_AMBULATORY_CARE_PROVIDER_SITE_OTHER): Admitting: Ophthalmology

## 2024-10-21 DIAGNOSIS — H353122 Nonexudative age-related macular degeneration, left eye, intermediate dry stage: Secondary | ICD-10-CM

## 2024-10-21 DIAGNOSIS — I10 Essential (primary) hypertension: Secondary | ICD-10-CM | POA: Diagnosis not present

## 2024-10-21 DIAGNOSIS — H43813 Vitreous degeneration, bilateral: Secondary | ICD-10-CM

## 2024-10-21 DIAGNOSIS — H35033 Hypertensive retinopathy, bilateral: Secondary | ICD-10-CM | POA: Diagnosis not present

## 2024-10-21 DIAGNOSIS — H353211 Exudative age-related macular degeneration, right eye, with active choroidal neovascularization: Secondary | ICD-10-CM

## 2024-10-28 NOTE — Progress Notes (Unsigned)
 Electrophysiology Office Note:   Date:  10/29/2024  ID:  HERSHY FLENNER, DOB 1941-06-30, MRN [id number]  Primary Cardiologist: [name], DO Electrophysiologist: [name], MD   Electrophysiologist:  [name], MD      History of Present Illness:   Isaac Clark is a 83 y.o. male with h/o atrial fibrillation/flutter, hypertension, hyperlipidemia, erectile dysfunction, type 2 diabetes seen today prior to scheduled ablation and concomitant watchman closure.  Patient has a history of atrial fibrillation, currently on Xarelto.  He was referred to Dr. Kitty by Dr. Large earlier this year to discuss management of A-fib as well as ongoing blood thinner management.  At that visit, patient noted prior instances of hematoma from only mild impact/injury.  Expressed concern about significant bleeding with just mild cuts.  Prior to his visit with Dr. Kitty, patient had undergone cardioversion and upon following up after this, reported feeling much better for about a week before his symptoms returned.  He experiences fatigue and heart racing sensation when in A-fib.  At his visit with Dr. Kitty, it was noted that his Apple Watch was reporting essentially 100% A-fib burden.  This was despite compliance with metoprolol  and flecainide .  Given persistent symptomatic atrial fibrillation and flutter along with concern that he is not a good candidate for long-term anticoagulation, plans made for A-fib ablation with concomitant watchman closure.  Since last being seen in our clinic the patient reports doing about the same, continues to have persistent afib that results in exertional fatigue.  he denies chest pain, PND, orthopnea, nausea, vomiting, dizziness, syncope, edema, weight gain, or early satiety.   Review of systems complete and found to be negative unless listed in HPI.   EP Information / Studies Reviewed:    EKG is ordered today. Personal review as below.  EKG  Interpretation Date/Time:  Tuesday October 29 2024 10:00:46 EST Ventricular Rate:  91 PR Interval:    QRS Duration:  140 QT Interval:  400 QTC Calculation: 492 R Axis:   -42  Text Interpretation: Atrial fibrillation with premature ventricular or aberrantly conducted complexes Left axis deviation Right bundle branch block Confirmed by [name] [phone]) on 10/29/2024 10:03:11 AM    Arrhythmia/Device History No specialty comments available.   Physical Exam:   VS:  BP 128/86   Pulse 90   Ht 5' 11 (1.803 m)   SpO2 98%   BMI 27.62 kg/m    Wt Readings from Last 3 Encounters:  07/23/24 198 lb (89.8 kg)  06/27/24 202 lb 9.6 oz (91.9 kg)  04/11/24 205 lb 3.2 oz (93.1 kg)     GEN: No acute distress NECK: No JVD CARDIAC: Irregularly irregular rate and rhythm, no murmurs, rubs, gallops RESPIRATORY:  Clear to auscultation without rales, wheezing or rhonchi  ABDOMEN: Soft, non-tender, non-distended EXTREMITIES:  No edema; No deformity   ASSESSMENT AND PLAN:    Persistent atrial fibrillation/flutter Secondary hypercoagulable state Patient now with symptomatic and persistent afib/flutter following DCCV earlier this year. He is pending ablation on 11/11/24 with Dr. Kitty. Concomitant Watchman closure also planned given hematoma/easy bleeding. ECG today with rate controlled afib. Stable clinical status without acute changes since prior visit with Dr. Kitty. Pre-procedure instructions reviewed with patient, printed copy provided. Emphasized importance of continuing Xarelto without missed doses. Also discussed day of procedure expectations, and post Watchman OAC/anti-platelet plan/timeline (45 days OAC before transition to ASA/Plavix through 81m post procedure).  Continue Toprol  XL 25mg  BID.   Hypertension Patient now taking increased Lisinopril   dose, 40mg  with improved BP control. Continue current regimen.   Hyperlipidemia Continue statin therapy.      Follow up with [facility] Clinic  as usual post procedure  Signed, Artist Pouch, PA-C

## 2024-10-29 ENCOUNTER — Ambulatory Visit: Attending: Physician Assistant | Admitting: Cardiology

## 2024-10-29 ENCOUNTER — Encounter: Payer: Self-pay | Admitting: Cardiology

## 2024-10-29 ENCOUNTER — Telehealth: Payer: Self-pay

## 2024-10-29 VITALS — BP 128/86 | HR 90 | Ht 71.0 in | Wt 212.0 lb

## 2024-10-29 DIAGNOSIS — D6869 Other thrombophilia: Secondary | ICD-10-CM | POA: Diagnosis not present

## 2024-10-29 DIAGNOSIS — I483 Typical atrial flutter: Secondary | ICD-10-CM | POA: Diagnosis not present

## 2024-10-29 DIAGNOSIS — I4892 Unspecified atrial flutter: Secondary | ICD-10-CM | POA: Diagnosis not present

## 2024-10-29 DIAGNOSIS — I4819 Other persistent atrial fibrillation: Secondary | ICD-10-CM | POA: Diagnosis not present

## 2024-10-29 DIAGNOSIS — I10 Essential (primary) hypertension: Secondary | ICD-10-CM

## 2024-10-29 MED ORDER — LISINOPRIL 40 MG PO TABS: 40.0000 mg | ORAL_TABLET | Freq: Every evening | ORAL | Status: AC

## 2024-10-29 NOTE — Patient Instructions (Signed)
 Medication Instructions:   Your physician recommends that you continue on your current medications as directed. Please refer to the Current Medication list given to you today.  *If you need a refill on your cardiac medications before your next appointment, please call your pharmacy*   Lab Work:  PLEASE GO DOWN STAIRS  [facility]  FIRST FLOOR   ( GET OFF ELEVATORS WALK TOWARDS WAITING AREA LAB LOCATED BY PHARMACY):  BMET AND  CBC  TODAY      If you have labs (blood work) drawn today and your tests are completely normal, you will receive your results only by: MyChart Message (if you have MyChart) OR A paper copy in the mail If you have any lab test that is abnormal or we need to change your treatment, we will call you to review the results.  Testing/Procedures:  SEE  LETTER  ATTACHED     Follow-Up:  At [facility], you and your health needs are our priority.  As part of our continuing mission to provide you with exceptional heart care, our providers are all part of one team.  This team includes your primary Cardiologist (physician) and Advanced Practice Providers or APPs (Physician Assistants and Nurse Practitioners) who all work together to provide you with the care you need, when you need it.  Your next appointment:  AS SCHEDULED     We recommend signing up for the patient portal called MyChart.  Sign up information is provided on this After Visit Summary.  MyChart is used to connect with patients for Virtual Visits (Telemedicine).  Patients are able to view lab/test results, encounter notes, upcoming appointments, etc.  Non-urgent messages can be sent to your provider as well.   To learn more about what you can do with MyChart, go to [email or web address].    Other Instructions

## 2024-10-29 NOTE — Telephone Encounter (Signed)
 Isaac Clark

## 2024-10-30 ENCOUNTER — Ambulatory Visit: Payer: Self-pay | Admitting: Cardiology

## 2024-10-30 LAB — BASIC METABOLIC PANEL WITH GFR
BUN/Creatinine Ratio: 15 (ref 10–24)
BUN: 16 mg/dL (ref 8–27)
CO2: 24 mmol/L (ref 20–29)
Calcium: 9.6 mg/dL (ref 8.6–10.2)
Chloride: 99 mmol/L (ref 96–106)
Creatinine, Ser: 1.06 mg/dL (ref 0.76–1.27)
Glucose: 145 mg/dL — ABNORMAL HIGH (ref 70–99)
Potassium: 4.3 mmol/L (ref 3.5–5.2)
Sodium: 140 mmol/L (ref 134–144)
eGFR: 71 mL/min/1.73 (ref >59)

## 2024-10-30 LAB — CBC
Hematocrit: 43.5 % (ref 37.5–51.0)
Hemoglobin: 14.2 g/dL (ref 13.0–17.7)
MCH: 29.2 pg (ref 26.6–33.0)
MCHC: 32.6 g/dL (ref 31.5–35.7)
MCV: 89 fL (ref 79–97)
Platelets: 212 x10E3/uL (ref 150–450)
RBC: 4.87 x10E6/uL (ref 4.14–5.80)
RDW: 13 % (ref 11.6–15.4)
WBC: 4.9 x10E3/uL (ref 3.4–10.8)

## 2024-11-05 ENCOUNTER — Telehealth: Payer: Self-pay

## 2024-11-05 NOTE — Telephone Encounter (Signed)
 Confirmed procedure date of 11/11/2024. Confirmed arrival time of 0730 for procedure time at 1000. Reviewed pre-procedure instructions with patient. Contrast allergy? No   Upon further discussion, patient stated he is coming down with some type of cold as of Saturday. He has sore throat, nasal congestion, productive cough. Denies fever or chills. He is taking mucinex. He is seeing how he feels today and will determine if he is going to see PCP. Advised patient will speak with Dr. Kennyth if case should be postponed.

## 2024-11-07 NOTE — Telephone Encounter (Signed)
 Left voicemail for patient to return to call to get update on symptoms.

## 2024-11-07 NOTE — Telephone Encounter (Signed)
 Patient returned call.   He reports he is feeling much better. He no longer has a sore throat. He still has a productive cough with clear/white sputum. His nasal congestion is present but much improved (almost similar to when he has had seasonal allergies). Denied fever/chills.   He did not see a doctor because he has felt like he is improving.   Advised would speak with Dr. Kennyth and get back to him if case should continue as scheduled for Monday, 12/8.

## 2024-11-07 NOTE — Telephone Encounter (Signed)
 Spoke with patient. Advised okay to proceed as his symptoms are improving. Reviewed pre-procedural instructions again.  Confirmed procedure date of 11/11/2024. Confirmed arrival time of 0730 for procedure time at 1000. Reviewed pre-procedure instructions with patient. Contrast allergy? No PPM or defibrillator? No The patient understands to call if questions/concerns arise prior to procedure. The patient was grateful for call and agreed with plan.

## 2024-11-08 NOTE — Telephone Encounter (Addendum)
 Per Dr. Kennyth, Anesthesia is requesting to cancel case on Monday.  Spoke with patient. Advised case will be cancelled on Monday due to his recent illness. Will reach out to him with a new date once further discussion is had with Dr. Kennyth. Patient verbalized understanding.   Cath lab notified of cancellation.

## 2024-11-11 ENCOUNTER — Encounter (HOSPITAL_COMMUNITY): Payer: Self-pay | Admitting: Vascular Surgery

## 2024-11-11 ENCOUNTER — Encounter (HOSPITAL_COMMUNITY): Admission: RE | Payer: Self-pay

## 2024-11-11 ENCOUNTER — Inpatient Hospital Stay (HOSPITAL_COMMUNITY): Admission: RE | Admit: 2024-11-11 | Admitting: Cardiology

## 2024-11-11 SURGERY — ATRIAL FIBRILLATION ABLATION
Anesthesia: General

## 2024-11-13 ENCOUNTER — Encounter: Payer: Self-pay | Admitting: Cardiology

## 2024-11-15 NOTE — Telephone Encounter (Signed)
 Spoke with patient. Arranged concomitant PVI/LAAO 1/12 with Dr. Kennyth.   Arranged pre-procedure OV with Charlies Arthur PA-C.

## 2024-12-09 ENCOUNTER — Other Ambulatory Visit: Payer: Self-pay

## 2024-12-09 DIAGNOSIS — I4819 Other persistent atrial fibrillation: Secondary | ICD-10-CM

## 2024-12-09 NOTE — Progress Notes (Unsigned)
" °  Cardiology Office Note:  .   Date:  12/09/2024  ID:  BRAXDEN Clark, DOB 1941-01-22, MRN [id number] PCP: Isaac Lombard, MD  [facility] Providers Cardiologist:  Isaac Large, DO Electrophysiologist:  Isaac Kitty, MD {  History of Present Illness: Isaac Clark is a 84 y.o. male w/PMHx of  HTN, HLD, DM AFib/flutter RBBB  He was referred to Dr. Kitty for recommendations on AFib management and concerns of easy bleeding/bruising Reported mareked fatigue, weakness in Afib, significant concerns of easy bleeding with minor trauma, and an episode of feeling lightheaded causing a fall (no syncope) Was post DCCV and did feel better but had ERAF Metoprolol  dose was increased Planned for Afib ablation and LAAO/watchman implant  He saw Artist 10/29/24, discussed upcoming concomitant afib ablation/watchman procedures, scheduled 12/8, as well as post implant timeline No new symptoms or concerns  Procedures cancelled 2/2 patient illness Rescheduled 12/16/24   Today's visit is scheduled as pre-procedure visit, pending Aib ablation/watchman implant (previously cancelled 2/2 illness) ROS:   He feels tired, very poor energy in AFib Though outside of that/AFib symptoms, feels well No CP, SOB No near syncope or syncope No bleeding/signs of bleeding Reports excellent Xarelto/medication compliance  Arrhythmia/AAD hx Flecainide  (higher doing poorly tolerated) > stopped Sept 2025 failure to maintain SR  Studies Reviewed: SABRA    EKG done today and reviewed by myself:  AFib 101bpm, RBBB, LAD (likely a PVC as last beat/partially includded)  Echocardiogram: 05/17/2021: Left ventricle cavity is normal in size. Mild concentric hypertrophy of the left ventricle. Normal global wall motion. Normal LV systolic function with EF 68%. Normal diastolic filling pattern. Moderate (Grade II) mitral regurgitation. Mild tricuspid regurgitation. Estimated pulmonary artery systolic pressure 23  mmHg.   Stress Testing: Exercise tetrofosmin stress test  05/18/2021: Low risk.   Risk Assessment/Calculations:    Physical Exam:   VS:  There were no vitals taken for this visit.   Wt Readings from Last 3 Encounters:  10/29/24 212 lb (96.2 kg)  07/23/24 198 lb (89.8 kg)  06/27/24 202 lb 9.6 oz (91.9 kg)    GEN: Well nourished, well developed in no acute distress NECK: No JVD; No carotid bruits CARDIAC: irreg-irreg, no murmurs, rubs, gallops RESPIRATORY:  CTA b/l without rales, wheezing or rhonchi  ABDOMEN: Soft, non-tender, non-distended EXTREMITIES: No edema; No deformity   ASSESSMENT AND PLAN: .    persistent AFib AFlutter CHA2DS2Vasc is 4, on xarelto, appropriately dosed Quite symptomatic ins AFib, tired, no energy Home HRs generally 80's   We revisited procedures, he has no follow up questions regarding them, remains agreeable to proceed. Discussed importance of medication compliance/xarelto Discussed day of procedure expectations +/- same day discharge/staying overnight Discussed post watchman a/c timeline and f/u He has his bottle of soap still at home, unused  Secondary hypercoagulable state 2/2 AFib     Dispo: usual post procedure f/u, sooner if needed  Signed, Isaac Macario Arthur, PA-C   "

## 2024-12-11 ENCOUNTER — Ambulatory Visit (HOSPITAL_COMMUNITY): Admitting: Internal Medicine

## 2024-12-11 ENCOUNTER — Telehealth: Payer: Self-pay

## 2024-12-11 ENCOUNTER — Ambulatory Visit: Attending: Cardiovascular Disease | Admitting: Physician Assistant

## 2024-12-11 VITALS — BP 144/84 | HR 101 | Ht 71.0 in | Wt 197.0 lb

## 2024-12-11 DIAGNOSIS — I4819 Other persistent atrial fibrillation: Secondary | ICD-10-CM | POA: Diagnosis not present

## 2024-12-11 DIAGNOSIS — I4892 Unspecified atrial flutter: Secondary | ICD-10-CM | POA: Diagnosis not present

## 2024-12-11 DIAGNOSIS — Z01818 Encounter for other preprocedural examination: Secondary | ICD-10-CM

## 2024-12-11 DIAGNOSIS — D6869 Other thrombophilia: Secondary | ICD-10-CM | POA: Diagnosis not present

## 2024-12-11 LAB — BASIC METABOLIC PANEL WITH GFR
BUN/Creatinine Ratio: 14 (ref 10–24)
BUN: 14 mg/dL (ref 8–27)
CO2: 24 mmol/L (ref 20–29)
Calcium: 9.4 mg/dL (ref 8.6–10.2)
Chloride: 99 mmol/L (ref 96–106)
Creatinine, Ser: 1 mg/dL (ref 0.76–1.27)
Glucose: 166 mg/dL — ABNORMAL HIGH (ref 70–99)
Potassium: 4.8 mmol/L (ref 3.5–5.2)
Sodium: 140 mmol/L (ref 134–144)
eGFR: 76 mL/min/1.73

## 2024-12-11 LAB — CBC
Hematocrit: 43.4 % (ref 37.5–51.0)
Hemoglobin: 13.8 g/dL (ref 13.0–17.7)
MCH: 28.9 pg (ref 26.6–33.0)
MCHC: 31.8 g/dL (ref 31.5–35.7)
MCV: 91 fL (ref 79–97)
Platelets: 221 x10E3/uL (ref 150–450)
RBC: 4.77 x10E6/uL (ref 4.14–5.80)
RDW: 14.1 % (ref 11.6–15.4)
WBC: 5.4 x10E3/uL (ref 3.4–10.8)

## 2024-12-11 NOTE — Telephone Encounter (Signed)
 Patient scheduled for concomitant ablation/watchman 12/16/24.  Confirmed procedure date of 12/16/24. Confirmed arrival time of 0730 for procedure time at 1000. Reviewed pre-procedure instructions with patient. Contrast allergy? No PPM or defibrillator? No The patient understands to call if questions/concerns arise prior to procedure. The patient was grateful for call and agreed with plan.

## 2024-12-11 NOTE — Patient Instructions (Signed)
 Medication Instructions:   Your physician recommends that you continue on your current medications as directed. Please refer to the Current Medication list given to you today.   *If you need a refill on your cardiac medications before your next appointment, please call your pharmacy*   Lab Work:   PLEASE GO DOWN STAIRS  [facility]  FIRST FLOOR   ( GET OFF ELEVATORS WALK TOWARDS WAITING AREA LAB LOCATED BY PHARMACY):   BMET  AND   CBC TODAY     If you have labs (blood work) drawn today and your tests are completely normal, you will receive your results only by: MyChart Message (if you have MyChart) OR A paper copy in the mail If you have any lab test that is abnormal or we need to change your treatment, we will call you to review the results.    Follow-Up: At [facility], you and your health needs are our priority.  As part of our continuing mission to provide you with exceptional heart care, our providers are all part of one team.  This team includes your primary Cardiologist (physician) and Advanced Practice Providers or APPs (Physician Assistants and Nurse Practitioners) who all work together to provide you with the care you need, when you need it.  Your next appointment:  AS  INSTRUCTED      We recommend signing up for the patient portal called MyChart.  Sign up information is provided on this After Visit Summary.  MyChart is used to connect with patients for Virtual Visits (Telemedicine).  Patients are able to view lab/test results, encounter notes, upcoming appointments, etc.  Non-urgent messages can be sent to your provider as well.   To learn more about what you can do with MyChart, go to [email or web address].   Other Instructions

## 2024-12-12 ENCOUNTER — Ambulatory Visit: Payer: Self-pay | Admitting: Physician Assistant

## 2024-12-12 ENCOUNTER — Encounter (INDEPENDENT_AMBULATORY_CARE_PROVIDER_SITE_OTHER): Admitting: Ophthalmology

## 2024-12-12 DIAGNOSIS — H43813 Vitreous degeneration, bilateral: Secondary | ICD-10-CM | POA: Diagnosis not present

## 2024-12-12 DIAGNOSIS — I10 Essential (primary) hypertension: Secondary | ICD-10-CM

## 2024-12-12 DIAGNOSIS — H35033 Hypertensive retinopathy, bilateral: Secondary | ICD-10-CM

## 2024-12-12 DIAGNOSIS — H353124 Nonexudative age-related macular degeneration, left eye, advanced atrophic with subfoveal involvement: Secondary | ICD-10-CM

## 2024-12-12 DIAGNOSIS — H353211 Exudative age-related macular degeneration, right eye, with active choroidal neovascularization: Secondary | ICD-10-CM

## 2024-12-15 NOTE — Pre-Procedure Instructions (Signed)
 Notified patient that his arrival time has been changed.  New arrival time is 0530.  Procedure time is 0730.  He verbalized understanding.

## 2024-12-16 ENCOUNTER — Inpatient Hospital Stay (HOSPITAL_COMMUNITY): Admitting: Anesthesiology

## 2024-12-16 ENCOUNTER — Encounter (HOSPITAL_COMMUNITY): Admission: RE | Payer: Self-pay | Source: Home / Self Care

## 2024-12-16 ENCOUNTER — Encounter (HOSPITAL_COMMUNITY): Payer: Self-pay | Admitting: Cardiology

## 2024-12-16 ENCOUNTER — Encounter (INDEPENDENT_AMBULATORY_CARE_PROVIDER_SITE_OTHER): Admitting: Ophthalmology

## 2024-12-16 ENCOUNTER — Other Ambulatory Visit: Payer: Self-pay

## 2024-12-16 ENCOUNTER — Inpatient Hospital Stay (HOSPITAL_COMMUNITY)
Admission: RE | Admit: 2024-12-16 | Discharge: 2024-12-17 | DRG: 317 | Disposition: A | Attending: Cardiology | Admitting: Cardiology

## 2024-12-16 DIAGNOSIS — Z7901 Long term (current) use of anticoagulants: Secondary | ICD-10-CM | POA: Diagnosis not present

## 2024-12-16 DIAGNOSIS — I4892 Unspecified atrial flutter: Secondary | ICD-10-CM | POA: Diagnosis present

## 2024-12-16 DIAGNOSIS — I4819 Other persistent atrial fibrillation: Principal | ICD-10-CM

## 2024-12-16 DIAGNOSIS — N529 Male erectile dysfunction, unspecified: Secondary | ICD-10-CM | POA: Diagnosis present

## 2024-12-16 DIAGNOSIS — J45909 Unspecified asthma, uncomplicated: Secondary | ICD-10-CM

## 2024-12-16 DIAGNOSIS — Z95818 Presence of other cardiac implants and grafts: Secondary | ICD-10-CM

## 2024-12-16 DIAGNOSIS — Z79890 Hormone replacement therapy (postmenopausal): Secondary | ICD-10-CM

## 2024-12-16 DIAGNOSIS — E079 Disorder of thyroid, unspecified: Secondary | ICD-10-CM | POA: Diagnosis present

## 2024-12-16 DIAGNOSIS — I4891 Unspecified atrial fibrillation: Principal | ICD-10-CM | POA: Diagnosis present

## 2024-12-16 DIAGNOSIS — E119 Type 2 diabetes mellitus without complications: Secondary | ICD-10-CM | POA: Diagnosis present

## 2024-12-16 DIAGNOSIS — Z87891 Personal history of nicotine dependence: Secondary | ICD-10-CM

## 2024-12-16 DIAGNOSIS — Z006 Encounter for examination for normal comparison and control in clinical research program: Secondary | ICD-10-CM

## 2024-12-16 DIAGNOSIS — Z9181 History of falling: Secondary | ICD-10-CM

## 2024-12-16 DIAGNOSIS — Z7982 Long term (current) use of aspirin: Secondary | ICD-10-CM | POA: Diagnosis not present

## 2024-12-16 DIAGNOSIS — Z79899 Other long term (current) drug therapy: Secondary | ICD-10-CM | POA: Diagnosis not present

## 2024-12-16 DIAGNOSIS — E785 Hyperlipidemia, unspecified: Secondary | ICD-10-CM | POA: Diagnosis present

## 2024-12-16 DIAGNOSIS — D6869 Other thrombophilia: Secondary | ICD-10-CM | POA: Diagnosis present

## 2024-12-16 DIAGNOSIS — I340 Nonrheumatic mitral (valve) insufficiency: Secondary | ICD-10-CM | POA: Diagnosis present

## 2024-12-16 DIAGNOSIS — I10 Essential (primary) hypertension: Secondary | ICD-10-CM | POA: Diagnosis present

## 2024-12-16 DIAGNOSIS — I452 Bifascicular block: Secondary | ICD-10-CM | POA: Diagnosis present

## 2024-12-16 DIAGNOSIS — Z7984 Long term (current) use of oral hypoglycemic drugs: Secondary | ICD-10-CM

## 2024-12-16 DIAGNOSIS — Z881 Allergy status to other antibiotic agents status: Secondary | ICD-10-CM

## 2024-12-16 HISTORY — PX: ATRIAL FIBRILLATION ABLATION: EP1191

## 2024-12-16 HISTORY — PX: A-FLUTTER ABLATION: EP1230

## 2024-12-16 HISTORY — PX: LEFT ATRIAL APPENDAGE OCCLUSION: EP1229

## 2024-12-16 LAB — POCT ACTIVATED CLOTTING TIME
Activated Clotting Time: 173 s
Activated Clotting Time: 306 s
Activated Clotting Time: 322 s

## 2024-12-16 LAB — GLUCOSE, CAPILLARY
Glucose-Capillary: 144 mg/dL — ABNORMAL HIGH (ref 70–99)
Glucose-Capillary: 150 mg/dL — ABNORMAL HIGH (ref 70–99)

## 2024-12-16 LAB — TYPE AND SCREEN
ABO/RH(D): O POS
Antibody Screen: NEGATIVE

## 2024-12-16 LAB — SURGICAL PCR SCREEN
MRSA, PCR: NEGATIVE
Staphylococcus aureus: POSITIVE — AB

## 2024-12-16 LAB — ABO/RH: ABO/RH(D): O POS

## 2024-12-16 MED ORDER — PROTAMINE SULFATE 10 MG/ML IV SOLN
INTRAVENOUS | Status: DC | PRN
  Administered 2024-12-16: 35 mg via INTRAVENOUS

## 2024-12-16 MED ORDER — CHLORHEXIDINE GLUCONATE 0.12 % MT SOLN: 15.0000 mL | Freq: Once | OROMUCOSAL | Status: AC

## 2024-12-16 MED ORDER — PHENYLEPHRINE HCL-NACL 20-0.9 MG/250ML-% IV SOLN
INTRAVENOUS | Status: DC | PRN
  Administered 2024-12-16: 25 ug/min via INTRAVENOUS

## 2024-12-16 MED ORDER — SODIUM CHLORIDE 0.9 % IV SOLN: 250.0000 mL | INTRAVENOUS | Status: DC | PRN

## 2024-12-16 MED ORDER — SODIUM CHLORIDE 0.9 % IV SOLN: INTRAVENOUS | Status: DC

## 2024-12-16 MED ORDER — ROCURONIUM BROMIDE 10 MG/ML (PF) SYRINGE
PREFILLED_SYRINGE | INTRAVENOUS | Status: DC | PRN
  Administered 2024-12-16: 80 mg via INTRAVENOUS
  Administered 2024-12-16: 30 mg via INTRAVENOUS
  Administered 2024-12-16 (×2): 20 mg via INTRAVENOUS

## 2024-12-16 MED ORDER — HEPARIN (PORCINE) IN NACL 1000-0.9 UT/500ML-% IV SOLN
INTRAVENOUS | Status: DC | PRN
  Administered 2024-12-16 (×2): 500 mL

## 2024-12-16 MED ORDER — CHLORHEXIDINE GLUCONATE 4 % EX SOLN: Freq: Once | CUTANEOUS | Status: DC

## 2024-12-16 MED ORDER — OXYCODONE HCL 5 MG PO TABS: 5.0000 mg | ORAL_TABLET | Freq: Once | ORAL | Status: DC | PRN

## 2024-12-16 MED ORDER — METOPROLOL SUCCINATE ER 25 MG PO TB24
25.0000 mg | ORAL_TABLET | Freq: Two times a day (BID) | ORAL | Status: DC
  Administered 2024-12-16 – 2024-12-17 (×2): 25 mg via ORAL
  Filled 2024-12-16 (×2): qty 1

## 2024-12-16 MED ORDER — LIDOCAINE 2% (20 MG/ML) 5 ML SYRINGE
INTRAMUSCULAR | Status: DC | PRN
  Administered 2024-12-16: 100 mg via INTRAVENOUS

## 2024-12-16 MED ORDER — PHENYLEPHRINE 80 MCG/ML (10ML) SYRINGE FOR IV PUSH (FOR BLOOD PRESSURE SUPPORT)
PREFILLED_SYRINGE | INTRAVENOUS | Status: DC | PRN
  Administered 2024-12-16: 80 ug via INTRAVENOUS
  Administered 2024-12-16: 160 ug via INTRAVENOUS

## 2024-12-16 MED ORDER — ACETAMINOPHEN 325 MG PO TABS
650.0000 mg | ORAL_TABLET | ORAL | Status: DC | PRN
  Administered 2024-12-16: 650 mg via ORAL

## 2024-12-16 MED ORDER — ONDANSETRON HCL 4 MG/2ML IJ SOLN: 4.0000 mg | Freq: Four times a day (QID) | INTRAMUSCULAR | Status: DC | PRN

## 2024-12-16 MED ORDER — HEPARIN (PORCINE) IN NACL 2000-0.9 UNIT/L-% IV SOLN
INTRAVENOUS | Status: DC | PRN
  Administered 2024-12-16: 1000 mL

## 2024-12-16 MED ORDER — CHLORHEXIDINE GLUCONATE 0.12 % MT SOLN
OROMUCOSAL | Status: AC
  Administered 2024-12-16: 15 mL via OROMUCOSAL
  Filled 2024-12-16: qty 15

## 2024-12-16 MED ORDER — ATROPINE SULFATE 1 MG/ML IV SOLN
INTRAVENOUS | Status: DC | PRN
  Administered 2024-12-16: 1 mg via INTRAVENOUS

## 2024-12-16 MED ORDER — ONDANSETRON HCL 4 MG/2ML IJ SOLN
INTRAMUSCULAR | Status: DC | PRN
  Administered 2024-12-16: 4 mg via INTRAVENOUS

## 2024-12-16 MED ORDER — RIVAROXABAN 20 MG PO TABS
20.0000 mg | ORAL_TABLET | Freq: Every day | ORAL | Status: DC
  Administered 2024-12-16 – 2024-12-17 (×2): 20 mg via ORAL
  Filled 2024-12-16 (×3): qty 1

## 2024-12-16 MED ORDER — CEFAZOLIN SODIUM-DEXTROSE 2-4 GM/100ML-% IV SOLN
2.0000 g | INTRAVENOUS | Status: AC
  Administered 2024-12-16: 2 g via INTRAVENOUS

## 2024-12-16 MED ORDER — PROPOFOL 1000 MG/100ML IV EMUL
INTRAVENOUS | Status: AC
  Filled 2024-12-16: qty 100

## 2024-12-16 MED ORDER — IOHEXOL 350 MG/ML SOLN
INTRAVENOUS | Status: DC | PRN
  Administered 2024-12-16: 15 mL

## 2024-12-16 MED ORDER — INSULIN ASPART 100 UNIT/ML IJ SOLN: 0.0000 [IU] | INTRAMUSCULAR | Status: DC | PRN

## 2024-12-16 MED ORDER — HEPARIN SODIUM (PORCINE) 1000 UNIT/ML IJ SOLN
INTRAMUSCULAR | Status: DC | PRN
  Administered 2024-12-16: 1000 [IU] via INTRAVENOUS

## 2024-12-16 MED ORDER — ACETAMINOPHEN 325 MG PO TABS
ORAL_TABLET | ORAL | Status: AC
  Filled 2024-12-16: qty 2

## 2024-12-16 MED ORDER — OXYCODONE HCL 5 MG/5ML PO SOLN
5.0000 mg | Freq: Once | ORAL | Status: DC | PRN
  Filled 2024-12-16: qty 5

## 2024-12-16 MED ORDER — HYDROMORPHONE HCL 1 MG/ML IJ SOLN: 0.2500 mg | INTRAMUSCULAR | Status: DC | PRN

## 2024-12-16 MED ORDER — DEXAMETHASONE SOD PHOSPHATE PF 10 MG/ML IJ SOLN
INTRAMUSCULAR | Status: DC | PRN
  Administered 2024-12-16: 5 mg via INTRAVENOUS

## 2024-12-16 MED ORDER — SODIUM CHLORIDE 0.9% FLUSH: 3.0000 mL | INTRAVENOUS | Status: DC | PRN

## 2024-12-16 MED ORDER — CEFAZOLIN SODIUM-DEXTROSE 2-4 GM/100ML-% IV SOLN
INTRAVENOUS | Status: AC
  Filled 2024-12-16: qty 100

## 2024-12-16 MED ORDER — PROPOFOL 500 MG/50ML IV EMUL
INTRAVENOUS | Status: DC | PRN
  Administered 2024-12-16: 125 ug/kg/min via INTRAVENOUS

## 2024-12-16 MED ORDER — HEPARIN SODIUM (PORCINE) 1000 UNIT/ML IJ SOLN
INTRAMUSCULAR | Status: DC | PRN
  Administered 2024-12-16: 5000 [IU] via INTRAVENOUS
  Administered 2024-12-16: 10000 [IU] via INTRAVENOUS
  Administered 2024-12-16: 5000 [IU] via INTRAVENOUS
  Administered 2024-12-16: 3000 [IU] via INTRAVENOUS

## 2024-12-16 MED ORDER — SODIUM CHLORIDE 0.9% FLUSH
3.0000 mL | Freq: Two times a day (BID) | INTRAVENOUS | Status: DC
  Administered 2024-12-17 (×2): 3 mL via INTRAVENOUS

## 2024-12-16 MED ORDER — LACTATED RINGERS IV SOLN: INTRAVENOUS | Status: DC

## 2024-12-16 MED ORDER — PROPOFOL 10 MG/ML IV BOLUS
INTRAVENOUS | Status: DC | PRN
  Administered 2024-12-16: 120 mg via INTRAVENOUS

## 2024-12-16 MED ORDER — AMISULPRIDE (ANTIEMETIC) 5 MG/2ML IV SOLN: 10.0000 mg | Freq: Once | INTRAVENOUS | Status: DC | PRN

## 2024-12-16 NOTE — Transfer of Care (Signed)
 Immediate Anesthesia Transfer of Care Note  Patient: Isaac Clark  Procedure(s) Performed: ATRIAL FIBRILLATION ABLATION LEFT ATRIAL APPENDAGE OCCLUSION  Patient Location: Cath Lab  Anesthesia Type:General  Level of Consciousness: drowsy and patient cooperative  Airway & Oxygen Therapy: Patient Spontanous Breathing and Patient connected to face mask oxygen  Post-op Assessment: Report given to RN, Post -op Vital signs reviewed and stable, Patient moving all extremities, and Patient moving all extremities X 4  Post vital signs: Reviewed and stable  Last Vitals:  Vitals Value Taken Time  BP 108/76 12/16/24 11:30  Temp    Pulse 71 12/16/24 11:32  Resp 20 12/16/24 11:32  SpO2 99 % 12/16/24 11:32  Vitals shown include unfiled device data.  Last Pain:  Vitals:   12/16/24 9367  TempSrc:   PainSc: 0-No pain         Complications: There were no known notable events for this encounter.

## 2024-12-16 NOTE — H&P (Signed)
 "  Electrophysiology Note:   Date:  12/16/24  ID:  Isaac Clark, DOB Nov 13, 1941, MRN [id number]   Primary Cardiologist: Isaac Large, DO Electrophysiologist: Isaac Kitty, MD       History of Present Illness:   Isaac Clark is a 84 y.o. male with h/o hypertension, hyperlipidemia, erectile dysfunction, non-insulin -dependent diabetes mellitus type 2, former smoker who is being seen today for evaluation for atrial flutter ablation at the request of Dr. Large.    Discussed the use of AI scribe software for clinical note transcription with the patient, who gave verbal consent to proceed.   History of Present Illness Isaac Clark is an 84 year old male with atrial fibrillation who presents for evaluation of his condition and management of symptoms. He is accompanied by his wife, Isaac Clark. He was referred by Dr. Large for evaluation of atrial fibrillation and discussion of blood thinner management.   He has a history of atrial fibrillation and is currently on Xarelto . He describes a worsening bruise on his arm that began as a suspected bruise but started bleeding again, prompting him to wrap it. No recent falls or specific incidents could have caused the bruise, although he recalls a fall about three to four weeks ago when he felt lightheaded and weak, leading to a fall where he went face down. He mentions a previous hematoma in his hand from a minor impact, which required emergency room attention. He also notes significant bleeding even with minor cuts, a condition present even before starting blood thinners.   He describes feeling a significant difference after a recent cardioversion, noting improved well-being for a week before symptoms returned. He experiences fatigue, especially in the mornings, and reports his heart racing. His smart watch indicates he has been in atrial fibrillation 100% of the time in recent weeks. He is currently taking metoprolol  and flecainide  but reports that flecainide   is not effective in maintaining normal rhythm. His heart rate, previously in the fifties at rest, has increased to the eighties and nineties, occasionally exceeding 100 during activities like mowing the lawn, which leaves him feeling exhausted.   Interval: Patient presents today for planned ablation and Watchman. Reports feeling relatively well. No new or acute complaints.   Review of systems complete and found to be negative unless listed in HPI.    EP Information / Studies Reviewed:      EKG Interpretation Date/Time:                  Tuesday July 23 2024 09:10:44 EDT Ventricular Rate:         88 PR Interval:                   QRS Duration:             146 QT Interval:                 410 QTC Calculation:496 R Axis:                         -57   Text Interpretation:Atrial fibrillation Right bundle branch block Left anterior fascicular block Bifascicular block Septal infarct (cited on or before 11-Apr-2024) When compared with ECG of 01-Jul-2024 09:22, AF has replaced sinus rhythm Confirmed by Clark Isaac [phone]) on 07/23/2024 9:16:35 AM    EKG 06/27/24: AF    Exercise stress test  05/18/2021: Normal ECG stress. The patient exercised for 5 minutes and 44 seconds  of a Bruce protocol, achieving approximately 7.05 METs.  achieved 112% of MPHR. Accelerate heart rate response. Hypertensive BP response. Peak BP 216/120 mm Hg. Dyspnea and recovery chest pain noted during exercise stress testing. Rare PVC.  Myocardial perfusion is normal. Overall LV systolic function is normal without regional wall motion abnormalities. Stress LV EF: 69%.  No previous exam available for comparison. Low risk.    Echo 05/17/21:  Echocardiogram 05/17/2021:  Left ventricle cavity is normal in size. Mild concentric hypertrophy of  the left ventricle. Normal global wall motion. Normal LV systolic function  with EF 68%. Normal diastolic filling pattern.  Moderate (Grade II) mitral regurgitation.  Mild tricuspid  regurgitation. Estimated pulmonary artery systolic pressure  23 mmHg.    Risk Assessment/Calculations:     CHA2DS2-VASc Score = 5   This indicates a 7.2% annual risk of stroke. The patient's score is based upon: CHF History: 0 HTN History: 1 Diabetes History: 1 Stroke History: 0 Vascular Disease History: 1 Age Score: 2 Gender Score: 0             Physical Exam:    Today's Vitals   12/16/24 0608 12/16/24 0632  BP: (!) 149/100   Pulse: 89   Resp: 18   Temp: 97.9 F (36.6 C)   TempSrc: Oral   SpO2: 97%   Weight: 89.4 kg   Height: 5' 11 (1.803 m)   PainSc:  0-No pain   Body mass index is 27.49 kg/m.  GEN: Well nourished, well developed in no acute distress NECK: No JVD CARDIAC: Normal rate, regular rhythm RESPIRATORY:  Clear to auscultation without rales, wheezing or rhonchi  ABDOMEN: Soft, non-distended EXTREMITIES:  No edema; No deformity    ASSESSMENT AND PLAN:     #Persistent atrial fibrillation/flutter: Symptomatic.  Reports marked improvement in symptoms after cardioversion during period of sinus rhythm.  Unfortunately, sinus rhythm was short-lived. #High risk medication use: Flecainide . No PR d/t AF and QRS . -Discussed treatment options today for AF including antiarrhythmic drug therapy and ablation. Discussed risks, recovery and likelihood of success with each treatment strategy. Risk, benefits, and alternatives to EP study and ablation for afib were discussed. These risks include but are not limited to stroke, bleeding, vascular damage, tamponade, perforation, damage to the esophagus, lungs, phrenic nerve and other structures, pulmonary vein stenosis, worsening renal function, coronary vasospasm and death.  Discussed potential need for repeat ablation procedures and antiarrhythmic drugs after an initial ablation. The patient understands these risk and wishes to proceed.  We will therefore proceed with catheter ablation today. -Increase metoprolol  XL to  25mg  BID.    #Hypercoagulable state due to atrial fibrillation:  I have seen Zachary LULLA Saunders in the office today who is being considered for a Watchman left atrial appendage closure device. I believe they will benefit from this procedure given their history of atrial fibrillation, CHA2DS2-VASc score of 5 and unadjusted ischemic stroke rate of 7.2% per year. Unfortunately, the patient is not felt to be a long term anticoagulation candidate secondary to falls, syncope, spontaneous hematomas and bleeding without a reversible cause. The patient's chart has been reviewed and I feel that they would be a candidate for short term oral anticoagulation after Watchman implant.    It is my belief that after undergoing a LAA closure procedure, Zachary LULLA Saunders will not need long term anticoagulation which eliminates anticoagulation side effects and major bleeding risk.    Procedural risks for the Watchman implant have been reviewed with the  patient including a 0.5% risk of stroke, <1% risk of perforation and <1% risk of device embolization. Other risks include bleeding, vascular damage, tamponade, worsening renal function, and death. The patient understands these risk and wishes to proceed.     The published clinical data on the safety and effectiveness of WATCHMAN include but are not limited to the following: - Holmes DR, Jess BEARD, Sick P et al. for the PROTECT AF Investigators. Percutaneous closure of the left atrial appendage versus warfarin therapy for prevention of stroke in patients with atrial fibrillation: a randomised non-inferiority trial. Lancet 2009; 374: 534-42. GLENWOOD Jess BEARD, Doshi SK, Jonita VEAR Satchel D et al. on behalf of the PROTECT AF Investigators. Percutaneous Left Atrial Appendage Closure for Stroke Prophylaxis in Patients With Atrial Fibrillation 2.3-Year Follow-up of the PROTECT AF (Watchman Left Atrial Appendage System for Embolic Protection in Patients With Atrial Fibrillation) Trial.  Circulation 2013; 127:720-729. - Alli O, Doshi S,  Kar S, Reddy VY, Sievert H et al. Quality of Life Assessment in the Randomized PROTECT AF (Percutaneous Closure of the Left Atrial Appendage Versus Warfarin Therapy for Prevention of Stroke in Patients With Atrial Fibrillation) Trial of Patients at Risk for Stroke With Nonvalvular Atrial Fibrillation. J Am Coll Cardiol 2013; 61:1790-8. GLENWOOD Satchel DR, Archer RAMAN, Price M, Whisenant B, Sievert H, Doshi S, Huber K, Reddy V. Prospective randomized evaluation of the Watchman left atrial appendage Device in patients with atrial fibrillation versus long-term warfarin therapy; the PREVAIL trial. Journal of the [employer] of Cardiology, Vol. 4, No. 1, 2014, 1-11. - Kar S, Doshi SK, Sadhu A, Horton R, Osorio J et al. Primary outcome evaluation of a next-generation left atrial appendage closure device: results from the PINNACLE FLX trial. Circulation 2021;143(18)1754-1762.    HAS-BLED score 3 Hypertension Yes  Abnormal renal and liver function (Dialysis, transplant, Cr >2.26 mg/dL /Cirrhosis or Bilirubin >2x Normal or AST/ALT/AP >3x Normal) No  Stroke No  Bleeding Yes  Labile INR (Unstable/high INR) No  Elderly (>65) Yes  Drugs or alcohol (>= 8 drinks/week, anti-plt or NSAID) No    CHA2DS2-VASc Score = 5  The patient's score is based upon: CHF History: 0 HTN History: 1 Diabetes History: 1 Stroke History: 0 Vascular Disease History: 1 Age Score: 2 Gender Score: 0       We will plan for concomitant AF/AFL ablation and Watchman implant to today.   Follow up with Dr. Kennyth 3 months after ablation/Watchman.    Signed, Isaac Kennyth, MD      "

## 2024-12-16 NOTE — Anesthesia Postprocedure Evaluation (Signed)
"   Anesthesia Post Note  Patient: Isaac Clark  Procedure(s) Performed: ATRIAL FIBRILLATION ABLATION LEFT ATRIAL APPENDAGE OCCLUSION     Patient location during evaluation: PACU Anesthesia Type: General Level of consciousness: awake and alert Pain management: pain level controlled Vital Signs Assessment: post-procedure vital signs reviewed and stable Respiratory status: spontaneous breathing, nonlabored ventilation and respiratory function stable Cardiovascular status: blood pressure returned to baseline and stable Postop Assessment: no apparent nausea or vomiting Anesthetic complications: no   There were no known notable events for this encounter.  Last Vitals:  Vitals:   12/16/24 1145 12/16/24 1200  BP: 114/79 111/77  Pulse: 70 69  Resp: 18 17  Temp:    SpO2: 100% 94%    Last Pain:  Vitals:   12/16/24 1130  TempSrc: Oral  PainSc:                  Butler Levander Pinal      "

## 2024-12-16 NOTE — Anesthesia Preprocedure Evaluation (Signed)
"                                    Anesthesia Evaluation  Patient identified by MRN, date of birth, ID band Patient awake    Reviewed: Allergy & Precautions, H&P , NPO status , Patient's Chart, lab work & pertinent test results  Airway Mallampati: II  TM Distance: >3 FB Neck ROM: Full    Dental  (+) Dental Advisory Given   Pulmonary asthma , former smoker   Pulmonary exam normal breath sounds clear to auscultation       Cardiovascular hypertension, Pt. on medications Normal cardiovascular exam+ dysrhythmias Atrial Fibrillation  Rhythm:Regular Rate:Normal     Neuro/Psych negative neurological ROS  negative psych ROS   GI/Hepatic negative GI ROS, Neg liver ROS,,,  Endo/Other  negative endocrine ROSdiabetes, Type 2    Renal/GU negative Renal ROS  negative genitourinary   Musculoskeletal  (+) Arthritis ,    Abdominal   Peds negative pediatric ROS (+)  Hematology negative hematology ROS (+)   Anesthesia Other Findings   Reproductive/Obstetrics negative OB ROS                              Anesthesia Physical Anesthesia Plan  ASA: 3  Anesthesia Plan: General   Post-op Pain Management:    Induction: Intravenous  PONV Risk Score and Plan: 2 and Ondansetron , Midazolam and Treatment may vary due to age or medical condition  Airway Management Planned: Oral ETT  Additional Equipment:   Intra-op Plan:   Post-operative Plan: Extubation in OR  Informed Consent: I have reviewed the patients History and Physical, chart, labs and discussed the procedure including the risks, benefits and alternatives for the proposed anesthesia with the patient or authorized representative who has indicated his/her understanding and acceptance.     Dental advisory given  Plan Discussed with: CRNA  Anesthesia Plan Comments:         Anesthesia Quick Evaluation  "

## 2024-12-16 NOTE — Discharge Instructions (Signed)
 " WATCHMAN Procedure, Care After  Procedure MD: Dr. Kennyth Olds Clinical Coordinator: Edsel Greek, RN and Rockie Redman, RN  This sheet gives you information about how to care for yourself after your procedure. Your health care provider may also give you more specific instructions. If you have problems or questions, contact your health care provider.  What can I expect after the procedure? After the procedure, it is common to have: Bruising around your puncture site. Tenderness around your puncture site. Tiredness (fatigue).  Medication instructions It is very important to continue to take your blood thinner as directed by your doctor after the Watchman procedure. Call your procedure doctors office with question or concerns. If you are on Coumadin (warfarin), you will have your INR checked the week after your procedure, with a goal INR of 2.0 - 3.0. Please follow your medication instructions on your discharge summary. Only take the medications listed on your discharge paperwork.  Follow up You will be seen in 6 weeks after your procedure You will have a repeat CT scan or Echocardiogram approximately 8 weeks after your procedure mark to check your device You will follow up the MD/APP who performed your procedure 6 months after your procedure The Watchman Clinical Coordinator will check in with you from time to time, including 1 and 2 years after your procedure.  NO DENTAL CLEANINGS FOR 45 days. After that, you will require antibiotics for dental procedures the first 6 months.   Follow these instructions at home: Puncture site care  Follow instructions from your health care provider about how to take care of your puncture site. Make sure you: If present, leave stitches (sutures), skin glue, or adhesive strips in place.  If a large square bandage is present, this may be removed 24 hours after surgery.  Check your puncture site every day for signs of infection. Check for: Redness,  swelling, or pain. Fluid or blood. If your puncture site starts to bleed, lie down on your back, apply firm pressure to the area, and contact your health care provider. Warmth. Pus or a bad smell. Driving Do not drive yourself home if you received sedation Do not drive for at least 4 days after your procedure or however long your health care provider recommends. (Do not resume driving if you have previously been instructed not to drive for other health reasons.) Do not spend greater than 1 hour at a time in a car for the first 3 days. Stop and take a break with a 5 minute walk at least every hour.  Do not drive or use heavy machinery while taking prescription pain medicine.  Activity Avoid activities that take a lot of effort, including exercise, for at least 7 days after your procedure. For the first 3 days, avoid sitting for longer than one hour at a time.  Avoid alcoholic beverages, signing paperwork, or participating in legal proceedings for 24 hours after receiving sedation Do not lift anything that is heavier than 10 lb (4.5 kg) for one week.  No sexual activity for 1 week.  Return to your normal activities as told by your health care provider. Ask your health care provider what activities are safe for you. General instructions Take over-the-counter and prescription medicines only as told by your health care provider. Do not use any products that contain nicotine or tobacco, such as cigarettes and e-cigarettes. If you need help quitting, ask your health care provider. You may shower after 24 hours, but Do not take baths, swim,  or use a hot tub for 1 week.  Do not drink alcohol for 24 hours after your procedure. Keep all follow-up visits as told by your health care provider. This is important. Dental Work: You will require antibiotics prior to any dental work, including cleanings, for 6 months after your Watchman implantation to help protect you from infection. After 6 months, antibiotics  are no longer required. Contact a health care provider if: You have redness, mild swelling, or pain around your puncture site. You have soreness in your throat or at your puncture site that does not improve after several days You have fluid or blood coming from your puncture site that stops after applying firm pressure to the area. Your puncture site feels warm to the touch. You have pus or a bad smell coming from your puncture site. You have a fever. You have chest pain or discomfort that spreads to your neck, jaw, or arm. You are sweating a lot. You feel nauseous. You have a fast or irregular heartbeat. You have shortness of breath. You are dizzy or light-headed and feel the need to lie down. You have pain or numbness in the arm or leg closest to your puncture site. Get help right away if: Your puncture site suddenly swells. Your puncture site is bleeding and the bleeding does not stop after applying firm pressure to the area. These symptoms may represent a serious problem that is an emergency. Do not wait to see if the symptoms will go away. Get medical help right away. Call your local emergency services (911 in the [place] Do not drive yourself to the hospital. Summary After the procedure, it is normal to have bruising and tenderness at the puncture site in your groin, neck, or forearm. Check your puncture site every day for signs of infection. Get help right away if your puncture site is bleeding and the bleeding does not stop after applying firm pressure to the area. This is a medical emergency.  This information is not intended to replace advice given to you by your health care provider. Make sure you discuss any questions you have with your health care provider.   "

## 2024-12-16 NOTE — Discharge Summary (Incomplete)
 "   Electrophysiology Discharge Summary   Patient ID: Isaac Clark,  MRN: [id number], DOB/AGE: [birth year] 84 y.o.  Admit date: 12/16/2024 Discharge date: 12/17/2024   Primary Care Physician: [name], MD  Primary Cardiologist: [name], DO  Electrophysiologist: [name], MD     Primary Discharge Diagnosis:  Persistent Atrial Fibrillation Persistent Atrial Flutter Secondary hypercoagulable state Poor candidacy for long term anticoagulation due to falls, syncope, spontaneous hematomas, and bleeding without a clear reversible cause  Procedures This Admission:  Transeptal Puncture Intra-procedural TEE which showed no LAA thrombus Successful PVI. Successful ablation/isolation of the posterior wall. Successful ablation of the cavotricuspid isthmus with bidirectional block achieved.  Successful implantation of a 31mm Watchman Flx Pro left atrial occlusion device on 12/16/2024 No early apparent complications.  Brief HPI: Isaac Clark is a 84 y.o. male with a history of Persistent Atrial Fibrillation and Flutter who was referred to Electrophysiology in the outpatient setting    Hospital Course:  The patient was admitted and underwent ablation and left atrial appendage occlusive device placement as above.  The patient was monitored overnight and has done very well with no concerns. Groin site has been stable without evidence of hematoma or bleeding. Wound care and restrictions were reviewed with the patient.   The patient has been scheduled for post procedure follow up with EP Team in approximately 6 weeks. The pt was resumed on Xarelto  and and will continue for 60 days then at that time he will transition to Plavix 75mg  daily to complete 6 months of therapy. He will require dental SBE for 6 month post op and should refrain from dental work or cleanings for the first 45 days post implant. SBE to be RXd at follow up.   A repeat CT scan will be performed in  approximately 75 days to ensure proper seal of the device.    Physical Exam: Vitals:   12/16/24 2003 12/16/24 2332 12/16/24 2335 12/17/24 0400  BP: 125/86 117/66 117/66 124/68  Pulse: 63 66 72 60  Resp: 15 18  18   Temp: 97.7 F (36.5 C) 98.2 F (36.8 C)  97.6 F (36.4 C)  TempSrc: Oral Oral  Oral  SpO2: 100% 96%  94%  Weight:      Height:        GEN: Well nourished, well developed in no acute distress NECK: No JVD; No carotid bruits CARDIAC: Regular rate and rhythm, no murmurs, rubs, gallops RESPIRATORY:  Clear to auscultation without rales, wheezing or rhonchi  ABDOMEN: Soft, non-tender, non-distended EXTREMITIES:  No edema; No deformity. Groin site Stable     Discharge Medications:  Allergies as of 12/17/2024       Reactions   Zithromax [azithromycin] Diarrhea        Medication List     TAKE these medications    albuterol 108 (90 Base) MCG/ACT inhaler Commonly known as: VENTOLIN HFA Inhale 1-2 puffs into the lungs every 6 (six) hours as needed for wheezing or shortness of breath.   amLODipine  5 MG tablet Commonly known as: NORVASC  Take 1 tablet (5 mg total) by mouth daily. Contact Primary Care for refills   cholecalciferol 25 MCG (1000 UNIT) tablet Commonly known as: VITAMIN D3 Take 1,000 Units by mouth daily.   CINNAMON PO Take 1,000 mg by mouth daily.   ciprofloxacin 0.3 % ophthalmic solution Commonly known as: CILOXAN Place 1 drop into the right eye as needed (After eye injection).   ketotifen 0.035 % ophthalmic solution Commonly known  as: ZADITOR Place 1 drop into both eyes daily as needed (allergies).   levothyroxine 100 MCG tablet Commonly known as: SYNTHROID Take 100 mcg by mouth daily before breakfast.   lisinopril  40 MG tablet Commonly known as: ZESTRIL  Take 1 tablet (40 mg total) by mouth every evening.   metFORMIN 750 MG 24 hr tablet Commonly known as: GLUCOPHAGE-XR Take 750 mg by mouth daily.   metoprolol  succinate 25 MG 24 hr  tablet Commonly known as: Toprol  XL Take 1 tablet (25 mg total) by mouth in the morning and at bedtime.   PreserVision AREDS 2 Caps Take 1 capsule by mouth in the morning and at bedtime.   simvastatin 20 MG tablet Commonly known as: ZOCOR Take 20 mg by mouth daily.   Systane 0.4-0.3 % Gel ophthalmic gel Generic drug: Polyethyl Glycol-Propyl Glycol Place 1 Application into both eyes at bedtime.   tadalafil 20 MG tablet Commonly known as: CIALIS Take 20 mg by mouth daily as needed for erectile dysfunction.   Vitamin B 12 500 MCG Tabs Take 500 mcg by mouth daily. What changed: how much to take   Xarelto  20 MG Tabs tablet Generic drug: rivaroxaban  Take 20 mg by mouth daily.        Disposition:  Home with usual follow up as in AVS  Signed, Ozell Prentice Passey, PA-C  12/17/2024 8:35 AM   I have seen, examined the patient, and reviewed the above assessment and plan.    Hospital Course: Mr. Isaac Clark is an 84 year old male who presented on 1/12 for planned catheter ablation and concomitant Watchman device implant.  He underwent CTI ablation, posterior wall isolation and PVI, which was followed by successful 31 mm Watchman device implant with ICE guidance.  Patient was kept overnight for routine monitoring.  There were no obvious postprocedural complications.  He reported feeling relatively well the morning of discharge with no new or acute complaints.  General: Well developed, in no acute distress.  Neck: No JVD.  Cardiac: Normal rate, regular rhythm.  Resp: Normal work of breathing.  Ext: No edema. Bilateral groin sites without bleeding or hematoma. Neuro: No gross focal deficits.  Psych: Normal affect.   Assessment and Plan:  Persistent atrial fibrillation/flutter: S/p PVI and CTI ablation.  Hypercoagulable state due to AF/AFL: S/p 31mm Watchman Flx Pro device -Continue Xarelto  for 60 days, then transition to dual antiplatelet therapy with aspirin 81 mg and Plavix 75 mg  once daily.  Repeat imaging with CT scan in 75 days.   Duration of discharge encounter: 33 minutes.  [name], MD 12/18/2024 10:29 PM  "

## 2024-12-16 NOTE — Anesthesia Procedure Notes (Signed)
 Procedure Name: Intubation Date/Time: 12/16/2024 7:55 AM  Performed by: Elby Raelene SAUNDERS, CRNAPre-anesthesia Checklist: Patient identified, Emergency Drugs available, Suction available and Patient being monitored Patient Re-evaluated:Patient Re-evaluated prior to induction Oxygen Delivery Method: Circle System Utilized Preoxygenation: Pre-oxygenation with 100% oxygen Induction Type: IV induction Ventilation: Mask ventilation without difficulty Laryngoscope Size: Miller and 2 Grade View: Grade I Tube type: Oral Tube size: 7.5 mm Number of attempts: 1 Airway Equipment and Method: Stylet and Bite block Placement Confirmation: ETT inserted through vocal cords under direct vision, positive ETCO2 and breath sounds checked- equal and bilateral Secured at: 22 cm Tube secured with: Tape Dental Injury: Teeth and Oropharynx as per pre-operative assessment

## 2024-12-17 DIAGNOSIS — Z95818 Presence of other cardiac implants and grafts: Secondary | ICD-10-CM

## 2024-12-17 MED ORDER — VITAMIN B 12 500 MCG PO TABS: 500.0000 ug | ORAL_TABLET | Freq: Every day | ORAL | Status: AC

## 2024-12-17 MED FILL — Propofol IV Emul 1000 MG/100ML (10 MG/ML): INTRAVENOUS | Qty: 50 | Status: AC

## 2024-12-17 NOTE — Progress Notes (Signed)
Order received to discharge patient.  Telemetry monitor removed and CCMD notified.  PIV access removed.  Discharge instructions, follow up, medications and instructions for their use discussed with patient. 

## 2024-12-17 NOTE — Progress Notes (Signed)
 Assisted by [facility] nurse Donny, Reviewed AVS, patient expressed understanding of medications, MD follow up reviewed.   Removed IV, Site clean, dry and intact.  Patient states all belongings brought to the hospital at time of admission are accounted for and packed to take home.  Patient informed and expressed understanding where to pick up discharge medications.  [facility] contacted to transport patient to entrance A, where family member was waiting in vehicle to transport home.

## 2024-12-17 NOTE — Progress Notes (Signed)
 Mobility Specialist Progress Note;   12/17/24 0936  Mobility  Activity Ambulated with assistance  Level of Assistance Contact guard assist, steadying assist  Assistive Device Front wheel walker  Distance Ambulated (ft) 200 ft  Activity Response Tolerated well  Mobility Referral Yes  Mobility visit 1 Mobility  Mobility Specialist Start Time (ACUTE ONLY) [id number]  Mobility Specialist Stop Time (ACUTE ONLY) 0944  Mobility Specialist Time Calculation (min) (ACUTE ONLY) 8 min   RN requesting pt to ambulate prior to d/c. Required MinG assistance during ambulation for safety. VSS throughout and no c/o when asked. Pt returned back to sitting on EoB and left with all needs met. RN notified of progress.   Lauraine Erm Mobility Specialist Please contact via SecureChat or [employer] [phone]

## 2024-12-17 NOTE — Progress Notes (Signed)
" °   12/17/24 1012  TOC Brief Assessment  Insurance and Status Reviewed  Patient has primary care physician Yes  Home environment has been reviewed home w/ spouse  Prior level of function: self/independent  Prior/Current Home Services No current home services  Social Drivers of Health Review SDOH reviewed no interventions necessary  Readmission risk has been reviewed Yes  Transition of care needs no transition of care needs at this time    Pt s/p Watchman placement, stable for transition home today. No HH or DME needs noted. Family will transport home.  "

## 2024-12-18 ENCOUNTER — Telehealth: Payer: Self-pay

## 2024-12-18 DIAGNOSIS — Z95818 Presence of other cardiac implants and grafts: Secondary | ICD-10-CM

## 2024-12-18 DIAGNOSIS — I4819 Other persistent atrial fibrillation: Secondary | ICD-10-CM

## 2024-12-18 NOTE — Transitions of Care (Post Inpatient/ED Visit) (Signed)
 "  12/18/2024  Name: Isaac Clark MRN: [id number] DOB: Feb 20, 1941  Today's TOC FU Call Status: Today's TOC FU Call Status:: Successful TOC FU Call Completed TOC FU Call Complete Date: 12/18/24  Patient's Name and Date of Birth confirmed. Name, DOB  Transition Care Management Follow-up Telephone Call Date of Discharge: 12/17/24 Discharge Facility: [facility]) Type of Discharge: Inpatient Admission Primary Inpatient Discharge Diagnosis:: Atrial fibrillation/Watchman device implantation How have you been since you were released from the hospital?: Better Any questions or concerns?: No  Items Reviewed: Did you receive and understand the discharge instructions provided?: Yes Medications obtained,verified, and reconciled?: Yes (Medications Reviewed) Any new allergies since your discharge?: No Dietary orders reviewed?: Yes Type of Diet Ordered:: Heart Healthy Do you have support at home?: Yes People in Home [RPT]: spouse Name of Support/Comfort Primary Source: Isaac Clark  Medications Reviewed Today: Medications Reviewed Today     Reviewed by [name], RN (Case Manager) on 12/18/24 at 1231  Med List Status: <None>   Medication Order Taking? Sig Documenting Provider Last Dose Status Informant  albuterol (PROVENTIL HFA;VENTOLIN HFA) 108 (90 Base) MCG/ACT inhaler [id number] Yes Inhale 1-2 puffs into the lungs every 6 (six) hours as needed for wheezing or shortness of breath. [provider]  Active Self           Med Note MARC, SOPHIA A   Mon Oct 04, 2021  2:34 PM)    amLODipine  (NORVASC ) 5 MG tablet [id number] Yes Take 1 tablet (5 mg total) by mouth daily. Contact Primary Care for refills Tolia, Sunit, DO  Active Self  cholecalciferol (VITAMIN D3) 25 MCG (1000 UNIT) tablet [id number] Yes Take 1,000 Units by mouth daily. [provider]  Active Self  CINNAMON PO [id number] Yes Take 1,000 mg by mouth daily. [provider]  Active Self  ciprofloxacin  (CILOXAN) 0.3 % ophthalmic solution [id number] Yes Place 1 drop into the right eye as needed (After eye injection). [provider]  Active Self  Cyanocobalamin (VITAMIN B 12) 500 MCG TABS [id number] Yes Take 500 mcg by mouth daily. [name], PA-C  Active   ketotifen (ZADITOR) 0.035 % ophthalmic solution [id number] Yes Place 1 drop into both eyes daily as needed (allergies). [provider]  Active Self  levothyroxine (SYNTHROID, LEVOTHROID) 100 MCG tablet [id number] Yes Take 100 mcg by mouth daily before breakfast. [provider]  Active Self  lisinopril  (ZESTRIL ) 40 MG tablet [id number] Yes Take 1 tablet (40 mg total) by mouth every evening. [name], PA-C  Active Self  metFORMIN (GLUCOPHAGE-XR) 750 MG 24 hr tablet [id number] Yes Take 750 mg by mouth daily. [provider]  Active Self  metoprolol  succinate (TOPROL  XL) 25 MG 24 hr tablet [id number]  Take 1 tablet (25 mg total) by mouth in the morning and at bedtime. [name], MD  Active Self  Multiple Vitamins-Minerals (PRESERVISION AREDS 2) CAPS [id number] Yes Take 1 capsule by mouth in the morning and at bedtime. [provider]  Active Self  Polyethyl Glycol-Propyl Glycol (SYSTANE) 0.4-0.3 % GEL ophthalmic gel [id number] Yes Place 1 Application into both eyes at bedtime. [provider]  Active Self  simvastatin (ZOCOR) 20 MG tablet [id number] Yes Take 20 mg by mouth daily. [provider]  Active Self  tadalafil (CIALIS) 20 MG tablet [id number] Yes Take 20 mg by mouth daily as needed for erectile dysfunction. [provider]  Active Self  XARELTO  20 MG TABS tablet [id number] Yes Take 20 mg by  mouth daily. [provider]  Active Self            Home Care and Equipment/Supplies: Were Home Health Services Ordered?: NA Any new equipment or medical supplies ordered?: NA  Functional Questionnaire: Do you need assistance with bathing/showering or  dressing?: No Do you need assistance with meal preparation?: No Do you need assistance with eating?: No Do you have difficulty maintaining continence: No Do you need assistance with getting out of bed/getting out of a chair/moving?: No Do you have difficulty managing or taking your medications?: No  Follow up appointments reviewed: PCP Follow-up appointment confirmed?: No (patient to call PCP) MD Provider Line Number:[phone] Given: No Specialist Hospital Follow-up appointment confirmed?: Yes Date of Specialist follow-up appointment?: 01/16/25 Follow-Up Specialty Provider:: [facility] Do you need transportation to your follow-up appointment?: No Do you understand care options if your condition(s) worsen?: Yes-patient verbalized understanding  SDOH Interventions Today    Flowsheet Row Most Recent Value  SDOH Interventions   Food Insecurity Interventions Intervention Not Indicated  Housing Interventions Intervention Not Indicated  Transportation Interventions Intervention Not Indicated  Utilities Interventions Intervention Not Indicated    Isaac Maberry J. Cordelia Bessinger RN, MSN [facility]  [facility], [facility] RN Care Manager Direct Dial: [phone]  Fax: [phone] Website: [email or web address]   "

## 2024-12-18 NOTE — Patient Instructions (Signed)
 Visit Information  Thank you for taking time to visit with me today. Please don't hesitate to contact me if I can be of assistance to you.  Contact a health care provider if: You have redness, mild swelling, or pain around your puncture site. You have soreness in your throat or at your puncture site that does not improve after several days You have fluid or blood coming from your puncture site that stops after applying firm pressure to the area. Your puncture site feels warm to the touch. You have pus or a bad smell coming from your puncture site. You have a fever. You have chest pain or discomfort that spreads to your neck, jaw, or arm. You are sweating a lot. You feel nauseous. You have a fast or irregular heartbeat. You have shortness of breath. You are dizzy or light-headed and feel the need to lie down. You have pain or numbness in the arm or leg closest to your puncture site.  Get help right away if: Your puncture site suddenly swells. Your puncture site is bleeding and the bleeding does not stop after applying firm pressure to the area.   Patient verbalizes understanding of instructions and care plan provided today and agrees to view in MyChart. Active MyChart status and patient understanding of how to access instructions and care plan via MyChart confirmed with patient.     The patient has been provided with contact information for the care management team and has been advised to call with any health related questions or concerns.   Please call the care guide team at [phone] if you need to cancel or reschedule your appointment.   Please call the Suicide and Crisis Lifeline: 988 if you are experiencing a Mental Health or Behavioral Health Crisis or need someone to talk to.  Taseen Marasigan J. Loretha Ure RN, MSN [facility], [facility] RN Care Manager Direct Dial: [phone]  Fax: [phone] Website: [email or web address]

## 2024-12-18 NOTE — Telephone Encounter (Signed)
" °  [facility]   Watchman Team  Contacted the patient regarding discharge from [facility] on 12/17/2024.  The patient understands to follow up with Thera Heinrich, PA-C on 01/16/2025.  The patient understands discharge instructions? Yes  The patient understands medications and regimen? Yes   The patient reports groin sites are sore but they are not bleeding or swollen. Patient has a bandage on one groin site. He will remove it later today. Patient aware he can shower but should not scrub at groin sites.  Scheduled post procedure imaging on 03/04/2025.  Reiterated to patient to avoid dental visits prior to their upcoming follow-up appointment. Instructed them to call after that visit if they have a dental visit so antibiotics may be called in. Reiterated they will need dental SBE through 6 months post-procedure.  The patient understands to call with any questions or concerns prior to scheduled visit.   The patient was grateful for call and agreed with plan.   "

## 2025-01-16 ENCOUNTER — Ambulatory Visit (HOSPITAL_COMMUNITY): Admitting: Internal Medicine

## 2025-02-06 ENCOUNTER — Encounter (INDEPENDENT_AMBULATORY_CARE_PROVIDER_SITE_OTHER): Admitting: Ophthalmology

## 2025-03-04 ENCOUNTER — Other Ambulatory Visit (HOSPITAL_COMMUNITY)

## 2025-03-17 ENCOUNTER — Ambulatory Visit: Admitting: Student
